# Patient Record
Sex: Female | Born: 1955 | Race: White | Hispanic: No | Marital: Married | Smoking: Never smoker
Health system: Southern US, Community
[De-identification: ages and names within clinical notes are randomized; demographics above are authoritative.]

## PROBLEM LIST (undated history)

## (undated) DIAGNOSIS — E079 Disorder of thyroid, unspecified: Secondary | ICD-10-CM

## (undated) DIAGNOSIS — K754 Autoimmune hepatitis: Secondary | ICD-10-CM

## (undated) HISTORY — DX: Autoimmune hepatitis: K75.4

## (undated) HISTORY — DX: Disorder of thyroid, unspecified: E07.9

## (undated) HISTORY — PX: PERCUTANEOUS LIVER BIOPSY: SUR136

---

## 2004-10-27 ENCOUNTER — Ambulatory Visit: Payer: Self-pay | Admitting: Unknown Physician Specialty

## 2005-09-07 ENCOUNTER — Ambulatory Visit: Payer: Self-pay | Admitting: Unknown Physician Specialty

## 2006-09-21 ENCOUNTER — Ambulatory Visit: Payer: Self-pay | Admitting: Unknown Physician Specialty

## 2006-12-06 ENCOUNTER — Ambulatory Visit: Payer: Self-pay | Admitting: Gastroenterology

## 2007-10-25 ENCOUNTER — Ambulatory Visit: Payer: Self-pay | Admitting: Unknown Physician Specialty

## 2008-10-25 ENCOUNTER — Ambulatory Visit: Payer: Self-pay | Admitting: Unknown Physician Specialty

## 2009-10-30 ENCOUNTER — Ambulatory Visit: Payer: Self-pay | Admitting: Unknown Physician Specialty

## 2010-11-03 ENCOUNTER — Ambulatory Visit: Payer: Self-pay | Admitting: Unknown Physician Specialty

## 2011-12-15 ENCOUNTER — Ambulatory Visit: Payer: Self-pay | Admitting: Unknown Physician Specialty

## 2011-12-16 ENCOUNTER — Encounter: Payer: Self-pay | Admitting: Internal Medicine

## 2011-12-16 ENCOUNTER — Ambulatory Visit (INDEPENDENT_AMBULATORY_CARE_PROVIDER_SITE_OTHER): Payer: PRIVATE HEALTH INSURANCE | Admitting: Internal Medicine

## 2011-12-16 DIAGNOSIS — E785 Hyperlipidemia, unspecified: Secondary | ICD-10-CM

## 2011-12-16 DIAGNOSIS — K754 Autoimmune hepatitis: Secondary | ICD-10-CM | POA: Insufficient documentation

## 2011-12-16 DIAGNOSIS — N951 Menopausal and female climacteric states: Secondary | ICD-10-CM

## 2011-12-16 NOTE — Assessment & Plan Note (Addendum)
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   Last LFts were normal 12/28.,  Needs q 6 months.  Dr. Woodfin Ganja at [facility]

## 2011-12-17 ENCOUNTER — Encounter: Payer: Self-pay | Admitting: Internal Medicine

## 2011-12-17 DIAGNOSIS — N951 Menopausal and female climacteric states: Secondary | ICD-10-CM | POA: Insufficient documentation

## 2011-12-17 DIAGNOSIS — K754 Autoimmune hepatitis: Secondary | ICD-10-CM | POA: Insufficient documentation

## 2011-12-17 NOTE — Assessment & Plan Note (Signed)
Manifested with hot flashes and depression both of which have improved with use of topical progesterone . I have discussed with her the fact that traditional medicine does not recognize utility of topical progesterone in the sense that it has not been shown to be anymore effective than placebo. However since she derives benefit from it I am willing to continue to refill her medications when needed.

## 2011-12-17 NOTE — Progress Notes (Signed)
Subjective:    Patient ID: Krista Olson, female    DOB: 12/01/1955, 56 y.o.   MRN: [id number]  HPI   Ms. Dargis is a 56 year old white female with a history of autoimmune hepatitis who is here to establish care. She is a patient of Dr. Francia Greaves and is transferring care today. She feels generally well except for occasional polyarthritis involving all the joints in her hands wrists and elbows. Her arthritis is aggravated by inactivity and improves after 10-15 minutes of motion in the mornings. She denies any history of joint redness and trauma. Her flares of arthritis or periodic. She has been treating them with nonsteroidal anti-inflammatories judiciously.  She has a history of menopausal disorder with hot flashes and some mood lability that has improved with use of hormone therapy in the form of topical progesterone.   Past Medical History  Diagnosis Date  . Hepatitis, autoimmune   . Thyroid disease    No current outpatient prescriptions on file prior to visit.    Review of Systems  Constitutional: Negative for fever, chills and unexpected weight change.  HENT: Negative for hearing loss, ear pain, nosebleeds, congestion, sore throat, facial swelling, rhinorrhea, sneezing, mouth sores, trouble swallowing, neck pain, neck stiffness, voice change, postnasal drip, sinus pressure, tinnitus and ear discharge.   Eyes: Negative for pain, discharge, redness and visual disturbance.  Respiratory: Negative for cough, chest tightness, shortness of breath, wheezing and stridor.   Cardiovascular: Negative for chest pain, palpitations and leg swelling.  Musculoskeletal: Negative for myalgias and arthralgias.  Skin: Negative for color change and rash.  Neurological: Negative for dizziness, weakness, light-headedness and headaches.  Hematological: Negative for adenopathy.   BP 118/70  Pulse 76  Temp(Src) 98.1 F (36.7 C) (Oral)  Ht 5' 2.25" (1.581 m)  Wt 122 lb (55.339 kg)  BMI 22.14 kg/m2       Objective:   Physical Exam  Constitutional: She is oriented to person, place, and time. She appears well-developed and well-nourished.  HENT:  Mouth/Throat: Oropharynx is clear and moist.  Eyes: EOM are normal. Pupils are equal, round, and reactive to light. No scleral icterus.  Neck: Normal range of motion. Neck supple. No JVD present. No thyromegaly present.  Cardiovascular: Normal rate, regular rhythm, normal heart sounds and intact distal pulses.   Pulmonary/Chest: Effort normal and breath sounds normal.  Abdominal: Soft. Bowel sounds are normal. She exhibits no mass. There is no tenderness.  Musculoskeletal: Normal range of motion. She exhibits no edema.  Lymphadenopathy:    She has no cervical adenopathy.  Neurological: She is alert and oriented to person, place, and time.  Skin: Skin is warm and dry.  Psychiatric: She has a normal mood and affect.      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   Last LFts were normal 12/28.,  Needs q 6 months.  Dr. Woodfin Ganja at [facility]   Other and unspecified hyperlipidemia Managed with statin therapy.  She would like to take a break and repeat her fasting lipids with her next 6 month LFT check in June   Menopause syndrome Manifested with hot flashes and depression both of which have improved with use of topical progesterone . I have discussed with her the fact that traditional medicine does not recognize utility of topical progesterone in the sense that it has not been shown to be anymore effective than placebo.  However since she derives benefit from it I am willing to continue to refill her medications when needed.    Updated Medication List Outpatient Encounter Prescriptions as of 12/16/2011  Medication Sig Dispense Refill  . atorvastatin (LIPITOR) 10 MG tablet Take 10 mg by mouth daily.      Marland Kitchen  azaTHIOprine (IMURAN) 50 MG tablet Take 50 mg by mouth daily.      Marland Kitchen levothyroxine (SYNTHROID, LEVOTHROID) 75 MCG tablet Take 75 mcg by mouth daily.      Marland Kitchen PROGESTERONE VA Use 3 grams vaginally for 20 days, stay off for 5 days

## 2011-12-17 NOTE — Assessment & Plan Note (Signed)
Managed with statin therapy.  She would like to take a break and repeat her fasting lipids with her next 6 month LFT check in June

## 2012-01-12 ENCOUNTER — Telehealth: Payer: Self-pay | Admitting: Internal Medicine

## 2012-01-12 NOTE — Telephone Encounter (Signed)
[facility] Triage Call Report Triage Record Num: [id number] Operator: Lesli Albee Patient Name: Krista Olson Call Date & Time: 01/12/2012 10:56:03AM Patient Phone: [phone] PCP: Patient Gender: Female PCP Fax : Patient DOB: [birth year] Practice Name: [facility] Reason for Call: Caller: [name]/Patient; PCP: Duncan Dull; CB#: [phone]; ; ; Call regarding Having Nasal Congestion and Drainage.; Pt has had nasal congestion x 2 days. No fever. Pt is calling to see if the office can call in anything for her. Rn advised abx or scripts would require an office visit. Pt is afeb. Rn triaged and advised home care with otc meds. Parameters reveiwed of when to call back. Pt flies on Thursday and is nervous about that. RN advised pt could come to the office for an office visit tomorrow prior to the flight. Protocol(s) Used: Upper Respiratory Infection (URI) Recommended Outcome per Protocol: Provide Home/Self Care Reason for Outcome: All other situations Care Advice: ~ Use a cool mist humidifier to moisten air. Be sure to clean according to manufacturer's instructions. ~ Consider use of a saline nasal spray per package directions to help relieve nasal congestion. ~ SYMPTOM / CONDITION MANAGEMENT Consider nonprescription decongestant (Sudafed, Drixoral) for relief of symptoms after checking with a provider, especially if there is a history of hypertension, hyperthyroidism, heart disease, diabetes, glaucoma, urinary retention caused by prostatic hypertrophy. ~ During pregnancy or when breastfeeding, do not take nonprescription, complementary/alternative medication(s) without the approval of provider ~ Analgesic/Antipyretic Advice - Acetaminophen: Consider acetaminophen as directed on label or by pharmacist/provider for pain or fever PRECAUTIONS: - Use if there is no history of liver disease, alcoholism, or intake of three or more alcohol drinks per day - Only if approved  by provider during pregnancy or when breastfeeding - During pregnancy, acetaminophen should not be taken more than 3 consecutive days without telling provider - Do not exceed recommended dose or frequency ~ 01/12/2012 11:02:17AM Page 1 of 1 [id number]

## 2012-01-13 NOTE — Telephone Encounter (Signed)
If patient does not come in for app, recommend use od sudafed PE or Afrin prior to flying to alleviate sinus pressure during changes in air pressure.  She can use up to 30 mg of sudafed PE ,  benadrylfor runny nose

## 2012-01-14 NOTE — Telephone Encounter (Signed)
Left message on cell phone voice mail advising patient. 

## 2012-05-06 ENCOUNTER — Other Ambulatory Visit: Payer: Self-pay | Admitting: Internal Medicine

## 2012-05-06 MED ORDER — ATORVASTATIN CALCIUM 10 MG PO TABS: 10.0000 mg | ORAL_TABLET | Freq: Every day | ORAL | Status: DC

## 2012-06-13 ENCOUNTER — Telehealth: Payer: Self-pay | Admitting: *Deleted

## 2012-06-13 ENCOUNTER — Other Ambulatory Visit: Payer: Self-pay | Admitting: Internal Medicine

## 2012-06-13 DIAGNOSIS — E079 Disorder of thyroid, unspecified: Secondary | ICD-10-CM

## 2012-06-13 DIAGNOSIS — E785 Hyperlipidemia, unspecified: Secondary | ICD-10-CM

## 2012-06-13 NOTE — Telephone Encounter (Signed)
Patient is coming in tomorrow for labs. What would you like for me to order?

## 2012-06-13 NOTE — Telephone Encounter (Signed)
Labs ordered.

## 2012-06-13 NOTE — Telephone Encounter (Signed)
Fasting lipids,  CMET.   TSH,  And CBC wi diff

## 2012-06-14 ENCOUNTER — Other Ambulatory Visit (INDEPENDENT_AMBULATORY_CARE_PROVIDER_SITE_OTHER): Payer: PRIVATE HEALTH INSURANCE | Admitting: *Deleted

## 2012-06-14 ENCOUNTER — Other Ambulatory Visit: Payer: Self-pay | Admitting: Internal Medicine

## 2012-06-14 DIAGNOSIS — E079 Disorder of thyroid, unspecified: Secondary | ICD-10-CM

## 2012-06-14 DIAGNOSIS — N951 Menopausal and female climacteric states: Secondary | ICD-10-CM

## 2012-06-14 DIAGNOSIS — E785 Hyperlipidemia, unspecified: Secondary | ICD-10-CM

## 2012-06-14 LAB — COMPREHENSIVE METABOLIC PANEL
Albumin: 4.4 g/dL (ref 3.5–5.2)
Alkaline Phosphatase: 68 U/L (ref 39–117)
BUN: 12 mg/dL (ref 6–23)
CO2: 28 mEq/L (ref 19–32)
GFR: 114.04 mL/min (ref >60.00)
Glucose, Bld: 83 mg/dL (ref 70–99)
Potassium: 4 mEq/L (ref 3.5–5.1)

## 2012-06-14 LAB — CBC WITH DIFFERENTIAL/PLATELET
Basophils Absolute: 0 10*3/uL (ref 0.0–0.1)
Eosinophils Relative: 1.9 % (ref 0.0–5.0)
HCT: 42.6 % (ref 36.0–46.0)
Lymphocytes Relative: 34.6 % (ref 12.0–46.0)
Lymphs Abs: 2.9 10*3/uL (ref 0.7–4.0)
Monocytes Relative: 6.9 % (ref 3.0–12.0)
Neutrophils Relative %: 56.1 % (ref 43.0–77.0)
Platelets: 285 10*3/uL (ref 150.0–400.0)
RDW: 13.6 % (ref 11.5–14.6)
WBC: 8.5 10*3/uL (ref 4.5–10.5)

## 2012-06-14 LAB — TSH: TSH: 1.69 u[IU]/mL (ref 0.35–5.50)

## 2012-06-14 LAB — LIPID PANEL
HDL: 70.7 mg/dL (ref >39.00)
Triglycerides: 172 mg/dL — ABNORMAL HIGH (ref 0.0–149.0)

## 2012-06-14 NOTE — Progress Notes (Signed)
Opened in error

## 2012-06-15 LAB — LDL CHOLESTEROL, DIRECT: Direct LDL: 122.8 mg/dL

## 2012-06-27 ENCOUNTER — Other Ambulatory Visit: Payer: Self-pay | Admitting: Internal Medicine

## 2012-06-27 MED ORDER — LEVOTHYROXINE SODIUM 75 MCG PO TABS: 75.0000 ug | ORAL_TABLET | Freq: Every day | ORAL | Status: DC

## 2012-06-27 MED ORDER — ATORVASTATIN CALCIUM 10 MG PO TABS: 10.0000 mg | ORAL_TABLET | Freq: Every day | ORAL | Status: DC

## 2012-09-29 ENCOUNTER — Telehealth: Payer: Self-pay | Admitting: Internal Medicine

## 2012-09-29 ENCOUNTER — Other Ambulatory Visit: Payer: Self-pay

## 2012-09-29 MED ORDER — ATORVASTATIN CALCIUM 10 MG PO TABS: 10.0000 mg | ORAL_TABLET | Freq: Every day | ORAL | Status: DC

## 2012-09-29 MED ORDER — LEVOTHYROXINE SODIUM 75 MCG PO TABS: 75.0000 ug | ORAL_TABLET | Freq: Every day | ORAL | Status: DC

## 2012-09-29 NOTE — Telephone Encounter (Signed)
Refill request for Levothyroxine 75 mcg #30 3 R sent electronic to [facility].

## 2012-09-29 NOTE — Telephone Encounter (Signed)
Refill request for Lipitor 10 mg # 30 3 R sent electronic to [facility] pharmacy.

## 2012-09-29 NOTE — Telephone Encounter (Signed)
Pt schedule cpx in mar  She wanted to know if you wanted her to wait till mar to do her mammogram on can she get this done in dec [place]   Any time

## 2012-12-15 ENCOUNTER — Ambulatory Visit: Payer: Self-pay | Admitting: Internal Medicine

## 2012-12-28 ENCOUNTER — Encounter: Payer: Self-pay | Admitting: Internal Medicine

## 2013-01-30 ENCOUNTER — Ambulatory Visit (INDEPENDENT_AMBULATORY_CARE_PROVIDER_SITE_OTHER): Payer: PRIVATE HEALTH INSURANCE | Admitting: Internal Medicine

## 2013-01-30 ENCOUNTER — Other Ambulatory Visit (HOSPITAL_COMMUNITY)
Admission: RE | Admit: 2013-01-30 | Discharge: 2013-01-30 | Disposition: A | Discharge status: Home / Self Care | Payer: PRIVATE HEALTH INSURANCE | Source: Ambulatory Visit | Attending: Internal Medicine | Admitting: Internal Medicine

## 2013-01-30 ENCOUNTER — Encounter: Payer: Self-pay | Admitting: Internal Medicine

## 2013-01-30 ENCOUNTER — Other Ambulatory Visit: Payer: Self-pay | Admitting: Internal Medicine

## 2013-01-30 VITALS — BP 120/76 | HR 73 | Temp 98.2°F | Resp 16 | Ht 62.5 in | Wt 128.0 lb

## 2013-01-30 DIAGNOSIS — Z79899 Other long term (current) drug therapy: Secondary | ICD-10-CM

## 2013-01-30 DIAGNOSIS — Z23 Encounter for immunization: Secondary | ICD-10-CM

## 2013-01-30 DIAGNOSIS — E039 Hypothyroidism, unspecified: Secondary | ICD-10-CM

## 2013-01-30 DIAGNOSIS — E785 Hyperlipidemia, unspecified: Secondary | ICD-10-CM

## 2013-01-30 DIAGNOSIS — Z0000 Encounter for general adult medical examination without abnormal findings: Secondary | ICD-10-CM

## 2013-01-30 DIAGNOSIS — Z124 Encounter for screening for malignant neoplasm of cervix: Secondary | ICD-10-CM

## 2013-01-30 DIAGNOSIS — K754 Autoimmune hepatitis: Secondary | ICD-10-CM

## 2013-01-30 DIAGNOSIS — Z01419 Encounter for gynecological examination (general) (routine) without abnormal findings: Secondary | ICD-10-CM | POA: Insufficient documentation

## 2013-01-30 DIAGNOSIS — Z1151 Encounter for screening for human papillomavirus (HPV): Secondary | ICD-10-CM | POA: Insufficient documentation

## 2013-01-30 NOTE — Progress Notes (Signed)
Patient ID: Krista Olson, female   DOB: 18-Oct-1956, 57 y.o.   MRN: [id number]  Subjective:     Krista Olson is a 57 y.o. female and is here for a comprehensive physical exam. The patient reports no problems.  History   Social History  . Marital Status: Married    Spouse Name: N/A    Number of Children: N/A  . Years of Education: N/A   Occupational History  . Not on file.   Social History Main Topics  . Smoking status: Never Smoker   . Smokeless tobacco: Not on file  . Alcohol Use: Not on file  . Drug Use: Not on file  . Sexually Active: Not on file   Other Topics Concern  . Not on file   Social History Narrative  . No narrative on file   Health Maintenance  Topic Date Due  . Tetanus/tdap  10/25/1974  . Influenza Vaccine  07/24/2013  . Pap Smear  10/23/2014  . Mammogram  12/15/2014  . Colonoscopy  12/06/2016    The following portions of the patient's history were reviewed and updated as appropriate: allergies, current medications, past family history, past medical history, past social history, past surgical history and problem list.  Review of Systems A comprehensive review of systems was negative.   Objective:   General Appearance:    Alert, cooperative, no distress, appears stated age  Head:    Normocephalic, without obvious abnormality, atraumatic  Eyes:    PERRL, conjunctiva/corneas clear, EOM's intact, fundi    benign, both eyes  Ears:    Normal TM's and external ear canals, both ears  Nose:   Nares normal, septum midline, mucosa normal, no drainage    or sinus tenderness  Throat:   Lips, mucosa, and tongue normal; teeth and gums normal  Neck:   Supple, symmetrical, trachea midline, no adenopathy;    thyroid:  no enlargement/tenderness/nodules; no carotid   bruit or JVD  Back:     Symmetric, no curvature, ROM normal, no CVA tenderness  Lungs:     Clear to auscultation bilaterally, respirations unlabored  Chest Wall:    No tenderness or deformity   Heart:     Regular rate and rhythm, S1 and S2 normal, no murmur, rub   or gallop  Breast Exam:    No tenderness, masses, or nipple abnormality  Abdomen:     Soft, non-tender, bowel sounds active all four quadrants,    no masses, no organomegaly  Genitalia:    Pelvic: cervix normal in appearance, external genitalia normal, no adnexal masses or tenderness, no cervical motion tenderness, rectovaginal septum normal, uterus normal size, shape, and consistency and vagina normal without discharge  Extremities:   Extremities normal, atraumatic, no cyanosis or edema  Pulses:   2+ and symmetric all extremities  Skin:   Skin color, texture, turgor normal, no rashes or lesions  Lymph nodes:   Cervical, supraclavicular, and axillary nodes normal  Neurologic:   CNII-XII intact, normal strength, sensation and reflexes    throughout      Assessment:   Routine general medical examination at a health care facility Annual comprehensive exam was done including breast, pelvic and PAP smear. All screenings have been addressed .   Autoimmune hepatitis Managed with Imuran ,.  She is overdue for surveillance labs   Other and unspecified hyperlipidemia Well controlled on current regimen. Liver function stable, no changes today.    Updated Medication List Outpatient Encounter Prescriptions as of 01/30/2013  Medication  Sig Dispense Refill  . atorvastatin (LIPITOR) 10 MG tablet Take 1 tablet (10 mg total) by mouth daily.  30 tablet  3  . azaTHIOprine (IMURAN) 50 MG tablet Take 50 mg by mouth daily.      . Calcium Carbonate-Vitamin D (CALCIUM + D PO) Take 1 tablet by mouth daily.      . fish oil-omega-3 fatty acids 1000 MG capsule Take 1 capsule by mouth daily.      Marland Kitchen levothyroxine (SYNTHROID, LEVOTHROID) 75 MCG tablet Take 1 tablet (75 mcg total) by mouth daily.  30 tablet  3  . Multiple Vitamin (MULTIVITAMIN) tablet Take 1 tablet by mouth daily.      Marland Kitchen PROGESTERONE VA Use 3 grams vaginally for 20 days, stay off for 5  days       No facility-administered encounter medications on file as of 01/30/2013.

## 2013-01-30 NOTE — Patient Instructions (Addendum)
I will check on your vaccine status for TDap from Dr Lin Givens office   I will send your labs to Dr. Piedad Climes at [facility]

## 2013-01-31 ENCOUNTER — Encounter: Payer: Self-pay | Admitting: Internal Medicine

## 2013-01-31 LAB — CBC WITH DIFFERENTIAL/PLATELET
Basophils Absolute: 0 10*3/uL (ref 0.0–0.1)
Eosinophils Relative: 0.9 % (ref 0.0–5.0)
HCT: 38.6 % (ref 36.0–46.0)
Lymphocytes Relative: 31.1 % (ref 12.0–46.0)
Monocytes Relative: 8.9 % (ref 3.0–12.0)
Neutrophils Relative %: 58.9 % (ref 43.0–77.0)
Platelets: 231 10*3/uL (ref 150.0–400.0)
WBC: 7 10*3/uL (ref 4.5–10.5)

## 2013-01-31 LAB — COMPREHENSIVE METABOLIC PANEL
Albumin: 4.2 g/dL (ref 3.5–5.2)
CO2: 26 mEq/L (ref 19–32)
Calcium: 9.2 mg/dL (ref 8.4–10.5)
Chloride: 103 mEq/L (ref 96–112)
GFR: 107.35 mL/min (ref >60.00)
Glucose, Bld: 92 mg/dL (ref 70–99)
Sodium: 138 mEq/L (ref 135–145)
Total Bilirubin: 0.7 mg/dL (ref 0.3–1.2)
Total Protein: 7.6 g/dL (ref 6.0–8.3)

## 2013-01-31 LAB — TSH: TSH: 0.62 u[IU]/mL (ref 0.35–5.50)

## 2013-01-31 NOTE — Progress Notes (Deleted)
Patient ID: Krista Olson, female   DOB: February 02, 1956, 57 y.o.   MRN: [id number]   Patient Active Problem List  Diagnosis  . Autoimmune hepatitis  . Other and unspecified hyperlipidemia  . Menopause syndrome  . Routine general medical examination at a health care facility    Subjective:  CC:   Chief Complaint  Patient presents with  . Annual Exam    HPI:   Krista Olson a 57 y.o. female who presents  Past Medical History  Diagnosis Date  . Hepatitis, autoimmune   . Thyroid disease     Past Surgical History  Procedure Laterality Date  . Percutaneous liver biopsy         The following portions of the patient's history were reviewed and updated as appropriate: Allergies, current medications, and problem list.    Review of Systems:   12 Pt  review of systems was negative except those addressed in the HPI,     History   Social History  . Marital Status: Married    Spouse Name: N/A    Number of Children: N/A  . Years of Education: N/A   Occupational History  . Not on file.   Social History Main Topics  . Smoking status: Never Smoker   . Smokeless tobacco: Not on file  . Alcohol Use: Not on file  . Drug Use: Not on file  . Sexually Active: Not on file   Other Topics Concern  . Not on file   Social History Narrative  . No narrative on file    Objective:  BP 120/76  Pulse 73  Temp(Src) 98.2 F (36.8 C) (Oral)  Resp 16  Ht 5' 2.5" (1.588 m)  Wt 128 lb (58.06 kg)  BMI 23.02 kg/m2  SpO2 98%  General appearance: alert, cooperative and appears stated age Ears: normal TM's and external ear canals both ears Throat: lips, mucosa, and tongue normal; teeth and gums normal Neck: no adenopathy, no carotid bruit, supple, symmetrical, trachea midline and thyroid not enlarged, symmetric, no tenderness/mass/nodules Back: symmetric, no curvature. ROM normal. No CVA tenderness. Lungs: clear to auscultation bilaterally Heart: regular rate and rhythm, S1, S2  normal, no murmur, click, rub or gallop Abdomen: soft, non-tender; bowel sounds normal; no masses,  no organomegaly Pulses: 2+ and symmetric Skin: Skin color, texture, turgor normal. No rashes or lesions Lymph nodes: Cervical, supraclavicular, and axillary nodes normal.  Assessment and Plan:  Routine general medical examination at a health care facility Annual comprehensive exam was done including breast, pelvic and PAP smear. All screenings have been addressed .   Autoimmune hepatitis Managed with Imuran ,.  She is overdue for surveillance labs   Other and unspecified hyperlipidemia Well controlled on current regimen. Liver function stable, no changes today.    Updated Medication List Outpatient Encounter Prescriptions as of 01/30/2013  Medication Sig Dispense Refill  . atorvastatin (LIPITOR) 10 MG tablet Take 1 tablet (10 mg total) by mouth daily.  30 tablet  3  . azaTHIOprine (IMURAN) 50 MG tablet Take 50 mg by mouth daily.      . Calcium Carbonate-Vitamin D (CALCIUM + D PO) Take 1 tablet by mouth daily.      . fish oil-omega-3 fatty acids 1000 MG capsule Take 1 capsule by mouth daily.      Marland Kitchen levothyroxine (SYNTHROID, LEVOTHROID) 75 MCG tablet Take 1 tablet (75 mcg total) by mouth daily.  30 tablet  3  . Multiple Vitamin (MULTIVITAMIN) tablet Take 1 tablet by mouth  daily.      Marland Kitchen PROGESTERONE VA Use 3 grams vaginally for 20 days, stay off for 5 days       No facility-administered encounter medications on file as of 01/30/2013.     Orders Placed This Encounter  Procedures  . Comprehensive metabolic panel  . CBC with Differential  . TSH  . Varicella Zoster Abs, IgG/IgM    No Follow-up on file.

## 2013-01-31 NOTE — Assessment & Plan Note (Signed)
Well controlled on current regimen. Liver function stable, no changes today. 

## 2013-01-31 NOTE — Assessment & Plan Note (Signed)
Annual comprehensive exam was done including breast, pelvic and PAP smear. All screenings have been addressed .  

## 2013-01-31 NOTE — Assessment & Plan Note (Signed)
Managed with Imuran ,.  She is overdue for surveillance labs

## 2013-02-02 ENCOUNTER — Telehealth: Payer: Self-pay | Admitting: *Deleted

## 2013-02-02 NOTE — Telephone Encounter (Signed)
Refill Request  Progesterone 3 % HRT CRM   #30  Apply 1/2 ml one to two times daily for 25 days a month

## 2013-02-03 LAB — HM PAP SMEAR: HM Pap smear: NORMAL

## 2013-02-03 NOTE — Telephone Encounter (Signed)
Med phoned in °

## 2013-02-03 NOTE — Telephone Encounter (Signed)
Ok to refill,  By phone.  Epic does not have a 3% available.

## 2013-02-15 ENCOUNTER — Telehealth: Payer: Self-pay | Admitting: *Deleted

## 2013-02-15 NOTE — Telephone Encounter (Signed)
I do not see progesterone 3% in epic Please advise.

## 2013-02-15 NOTE — Telephone Encounter (Signed)
Refill request  Progesterone 3% hrt crm  #30  Apply 1/2 ML one to two times daily for 25 days a month

## 2013-02-15 NOTE — Telephone Encounter (Signed)
Ok to refill,  You will have to call in since this is coumpounded by the pharmacy .  11 refills.

## 2013-02-15 NOTE — Telephone Encounter (Signed)
Med Filled on 02/15/13 with 11 refills.

## 2013-03-06 ENCOUNTER — Other Ambulatory Visit: Payer: Self-pay | Admitting: *Deleted

## 2013-03-06 MED ORDER — ATORVASTATIN CALCIUM 10 MG PO TABS: 10.0000 mg | ORAL_TABLET | Freq: Every day | ORAL | Status: DC

## 2013-03-06 MED ORDER — LEVOTHYROXINE SODIUM 75 MCG PO TABS: 75.0000 ug | ORAL_TABLET | Freq: Every day | ORAL | Status: DC

## 2013-04-13 ENCOUNTER — Telehealth: Payer: Self-pay | Admitting: *Deleted

## 2013-04-13 NOTE — Telephone Encounter (Signed)
Would like for lab work especially labs concerning liver enzymes be faxed  faxed to [facility] Dr. Marylen Ponto. Piedad Climes MD phone [phone]. Please advise if I need any authorization.

## 2013-04-13 NOTE — Telephone Encounter (Signed)
No,  If patient requests it , that is all you need

## 2013-04-14 NOTE — Telephone Encounter (Signed)
Faxed to Dr. Harrel Lemon office, fax [phone]

## 2013-04-20 ENCOUNTER — Encounter: Payer: Self-pay | Admitting: Internal Medicine

## 2013-09-29 ENCOUNTER — Other Ambulatory Visit: Payer: Self-pay | Admitting: *Deleted

## 2013-09-29 MED ORDER — ATORVASTATIN CALCIUM 10 MG PO TABS: 10.0000 mg | ORAL_TABLET | Freq: Every day | ORAL | Status: DC

## 2013-09-29 MED ORDER — LEVOTHYROXINE SODIUM 75 MCG PO TABS: 75.0000 ug | ORAL_TABLET | Freq: Every day | ORAL | Status: DC

## 2014-02-01 ENCOUNTER — Encounter: Payer: Self-pay | Admitting: Internal Medicine

## 2014-02-01 ENCOUNTER — Encounter (INDEPENDENT_AMBULATORY_CARE_PROVIDER_SITE_OTHER): Payer: Self-pay

## 2014-02-01 ENCOUNTER — Ambulatory Visit (INDEPENDENT_AMBULATORY_CARE_PROVIDER_SITE_OTHER): Payer: PRIVATE HEALTH INSURANCE | Admitting: Internal Medicine

## 2014-02-01 VITALS — BP 120/70 | HR 66 | Temp 98.4°F | Resp 16 | Ht 62.0 in | Wt 128.0 lb

## 2014-02-01 DIAGNOSIS — E559 Vitamin D deficiency, unspecified: Secondary | ICD-10-CM

## 2014-02-01 DIAGNOSIS — Z1211 Encounter for screening for malignant neoplasm of colon: Secondary | ICD-10-CM

## 2014-02-01 DIAGNOSIS — R5383 Other fatigue: Secondary | ICD-10-CM

## 2014-02-01 DIAGNOSIS — Z1159 Encounter for screening for other viral diseases: Secondary | ICD-10-CM

## 2014-02-01 DIAGNOSIS — Z23 Encounter for immunization: Secondary | ICD-10-CM

## 2014-02-01 DIAGNOSIS — K754 Autoimmune hepatitis: Secondary | ICD-10-CM

## 2014-02-01 DIAGNOSIS — Z0000 Encounter for general adult medical examination without abnormal findings: Secondary | ICD-10-CM

## 2014-02-01 DIAGNOSIS — E785 Hyperlipidemia, unspecified: Secondary | ICD-10-CM

## 2014-02-01 DIAGNOSIS — N63 Unspecified lump in unspecified breast: Secondary | ICD-10-CM

## 2014-02-01 DIAGNOSIS — R5381 Other malaise: Secondary | ICD-10-CM

## 2014-02-01 NOTE — Patient Instructions (Addendum)
Return in late April for fast labs and repeat hepatic panel   You need 800 IUs of Vit D3 daily for good bone health  You had your wellness exam today.  Next PAP smear is due 2017  We will schedule your mammogram soon.  Please use the stool kit to send [place] back a sample to test for blood.  This is your colon CA screening test.    You received the TDap  vaccine today.  We will contact you with the bloodwork results

## 2014-02-01 NOTE — Progress Notes (Signed)
Patient ID: Krista Olson, female   DOB: 18-Apr-1956, 58 y.o.   MRN: [id number]   Subjective:     Krista Olson is a 58 y.o. female and is here for a comprehensive physical exam. The patient reports a  vague feeling in right breast,m  Intermittent,  Not pain or throbbing,  Difficult to describe,  Does not feel a mass.  History of autoimmune hepatitis .  Her ALT and AST and GGT  Were slightly elevated in late Jan at [facility].     Needs repeat quarterly per [facility]    History   Social History  . Marital Status: Married    Spouse Name: N/A    Number of Children: N/A  . Years of Education: N/A   Occupational History  . Not on file.   Social History Main Topics  . Smoking status: Never Smoker   . Smokeless tobacco: Not on file  . Alcohol Use: Not on file  . Drug Use: Not on file  . Sexual Activity: Not on file   Other Topics Concern  . Not on file   Social History Narrative  . No narrative on file   Health Maintenance  Topic Date Due  . Influenza Vaccine  06/23/2013  . Mammogram  12/15/2014  . Pap Smear  01/31/2016  . Colonoscopy  12/06/2016  . Tetanus/tdap  02/02/2024    The following portions of the patient's history were reviewed and updated as appropriate: allergies, current medications, past family history, past medical history, past social history, past surgical history and problem list.  Review of Systems A comprehensive review of systems was negative.   Objective:   BP 120/70  Pulse 66  Temp(Src) 98.4 F (36.9 C) (Oral)  Resp 16  Ht 5\' 2"  (1.575 m)  Wt 128 lb (58.06 kg)  BMI 23.41 kg/m2  SpO2 98%  General Appearance:    Alert, cooperative, no distress, appears stated age  Head:    Normocephalic, without obvious abnormality, atraumatic  Eyes:    PERRL, conjunctiva/corneas clear, EOM's intact, fundi    benign, both eyes  Ears:    Normal TM's and external ear canals, both ears  Nose:   Nares normal, septum midline, mucosa normal, no drainage    or sinus  tenderness  Throat:   Lips, mucosa, and tongue normal; teeth and gums normal  Neck:   Supple, symmetrical, trachea midline, no adenopathy;    thyroid:  no enlargement/tenderness/nodules; no carotid   bruit or JVD  Back:     Symmetric, no curvature, ROM normal, no CVA tenderness  Lungs:     Clear to auscultation bilaterally, respirations unlabored  Chest Wall:    No tenderness or deformity   Heart:    Regular rate and rhythm, S1 and S2 normal, no murmur, rub   or gallop  Breast Exam:    Small nontender mass at 9:00 position of right breast .  ;eft breast without tenderness, masses, or nipple abnormality  Abdomen:     Soft, non-tender, bowel sounds active all four quadrants,    no masses, no organomegaly     Extremities:   Extremities normal, atraumatic, no cyanosis or edema  Pulses:   2+ and symmetric all extremities  Skin:   Skin color, texture, turgor normal, no rashes or lesions  Lymph nodes:   Cervical, supraclavicular, and axillary nodes normal  Neurologic:   CNII-XII intact, normal strength, sensation and reflexes    throughout      Assessment and Plan:  Other and unspecified hyperlipidemia Historically Well controlled on current regimen. Liver function stable, no changes today.     Autoimmune hepatitis Managed with Imuran since 2013, quarterly labs are due.   Routine general medical examination at a health care facility Annual comprehensive exam was done including breast, excluding pelvic and PAP smear. All screenings have been addressed .   Breast nodule Palpable nodule in right breast was noted .  Given her concurrent concern about abnormal feeling in the breast diagnostic mammogram ordered.    Updated Medication List Outpatient Encounter Prescriptions as of 02/01/2014  Medication Sig  . atorvastatin (LIPITOR) 10 MG tablet Take 1 tablet (10 mg total) by mouth daily.  Marland Kitchen azaTHIOprine (IMURAN) 50 MG tablet Take 50 mg by mouth daily.  . Calcium Carbonate-Vitamin D  (CALCIUM + D PO) Take 1 tablet by mouth daily.  . fish oil-omega-3 fatty acids 1000 MG capsule Take 1 capsule by mouth daily.  Marland Kitchen levothyroxine (SYNTHROID, LEVOTHROID) 75 MCG tablet Take 1 tablet (75 mcg total) by mouth daily.  . Multiple Vitamin (MULTIVITAMIN) tablet Take 1 tablet by mouth daily.  Marland Kitchen PROGESTERONE VA Progesterone 3% hrt crm. Apply 1/5mL vaginally one to two times daily for 25 days a month. This must be Called in for refills since compunded. This was called in on 02/15/13 with 11 refills.

## 2014-02-03 ENCOUNTER — Encounter: Payer: Self-pay | Admitting: Internal Medicine

## 2014-02-03 DIAGNOSIS — N63 Unspecified lump in unspecified breast: Secondary | ICD-10-CM | POA: Insufficient documentation

## 2014-02-03 NOTE — Assessment & Plan Note (Signed)
Managed with Imuran since 2013, quarterly labs are due.

## 2014-02-03 NOTE — Assessment & Plan Note (Signed)
Historically Well controlled on current regimen. Liver function stable, no changes today.

## 2014-02-03 NOTE — Assessment & Plan Note (Signed)
Annual comprehensive exam was done including breast, excluding pelvic and PAP smear. All screenings have been addressed .  

## 2014-02-03 NOTE — Assessment & Plan Note (Signed)
Palpable nodule in right breast was noted .  Given her concurrent concern about abnormal feeling in the breast diagnostic mammogram ordered.

## 2014-02-22 ENCOUNTER — Ambulatory Visit: Payer: Self-pay | Admitting: Internal Medicine

## 2014-03-05 ENCOUNTER — Other Ambulatory Visit: Payer: Self-pay | Admitting: Internal Medicine

## 2014-03-06 ENCOUNTER — Telehealth: Payer: Self-pay | Admitting: *Deleted

## 2014-03-06 NOTE — Telephone Encounter (Signed)
Refill on progesterone on hold until breast issues have been fully explored, (diagnostic mammo was ordere at her physical last month and I have not record it was done)

## 2014-03-06 NOTE — Telephone Encounter (Signed)
Ok refill? 

## 2014-03-06 NOTE — Telephone Encounter (Signed)
Refill request  Progesterone 3% cream   Apply 1/2 ML topically to site directed by physician 1-2 times daily or 25 days/month

## 2014-03-11 LAB — HM MAMMOGRAPHY: HM Mammogram: NORMAL

## 2014-03-12 ENCOUNTER — Telehealth: Payer: Self-pay | Admitting: Internal Medicine

## 2014-03-12 ENCOUNTER — Telehealth: Payer: Self-pay | Admitting: *Deleted

## 2014-03-12 NOTE — Telephone Encounter (Signed)
Refill Request  Progesterone 3%  Apply 1/2 topically to site directed by physician 1-2 times daily or 25 days/month

## 2014-03-12 NOTE — Telephone Encounter (Signed)
FYI

## 2014-03-12 NOTE — Telephone Encounter (Signed)
Patient scheduled for tomorrow at 2.45. FYI

## 2014-03-12 NOTE — Telephone Encounter (Signed)
Patient Information:  Caller Name: Jeneva  Phone: [phone]  Patient: Krista Olson, Krista Olson  Gender: Female  DOB: 13-Aug-1956  Age: 58 Years  PCP: Deborra Medina (Adults only)  Office Follow Up:  Does the office need to follow up with this patient?: No  Instructions For The Office: N/A   Symptoms  Reason For Call & Symptoms: Sinus congestion onset 03/02/14 with associated cough.  Reviewed Health History In EMR: Yes  Reviewed Medications In EMR: Yes  Reviewed Allergies In EMR: Yes  Reviewed Surgeries / Procedures: Yes  Date of Onset of Symptoms: 03/02/2014  Treatments Tried: Claritin, Sudafed, Neti Pot with some relief  Treatments Tried Worked: Yes  Guideline(s) Used:  Sinus Pain and Congestion  Disposition Per Guideline:   See Today or Tomorrow in Office  Reason For Disposition Reached:   Sinus congestion (pressure, fullness) present > 10 days  Advice Given:  Reassurance:   Sinus congestion is a normal part of a cold.  Antibiotics are not helpful for the sinus congestion that occurs with colds.  For a Runny Nose With Profuse Discharge:  Nasal mucus and discharge helps to wash viruses and bacteria out of the nose and sinuses.  Blowing the nose is all that is needed.  If the skin around your nostrils gets irritated, apply a tiny amount of petroleum ointment to the nasal openings once or twice a day.  For a Stuffy Nose - Use Nasal Washes:  Introduction: Saline (salt water) nasal irrigation (nasal wash) is an effective and simple home remedy for treating stuffy nose and sinus congestion. The nose can be irrigated by pouring, spraying, or squirting salt water into the nose and then letting it run back out.  Methods: There are several ways to perform nasal irrigation. You can use a saline nasal spray bottle (available over-the-counter), a rubber ear syringe, a medical syringe without the needle, or a Neti Pot.  Pain and Fever Medicines:  Acetaminophen (e.g., Tylenol):  Regular Strength  Tylenol: Take 650 mg (two 325 mg pills) by mouth every 4-6 hours as needed. Each Regular Strength Tylenol pill has 325 mg of acetaminophen.  Extra Strength Tylenol: Take 1,000 mg (two 500 mg pills) every 8 hours as needed. Each Extra Strength Tylenol pill has 500 mg of acetaminophen.  The most you should take each day is 3,000 mg (10 Regular Strength or 6 Extra Strength pills a day).  Hydration:  Drink plenty of liquids (6-8 glasses of water daily). If the air in your home is dry, use a cool mist humidifier  Expected Course:  Sinus congestion from viral upper respiratory infections (colds) usually lasts 5-10 days.  Occasionally a cold can worsen and turn into bacterial sinusitis. Clues to this are sinus symptoms lasting longer than 10 days, fever lasting longer than 3 days, and worsening pain. Bacterial sinusitis may need antibiotic treatment.  Call Back If:   Sinus congestion (fullness) lasts longer than 10 days  You become worse.  RN Overrode Recommendation:  Document Patient  Caller also requests refill on Progesterone.

## 2014-03-12 NOTE — Telephone Encounter (Signed)
Phoned in as requested.

## 2014-03-12 NOTE — Telephone Encounter (Signed)
Pt left vm asking to discuss sinus issues.  Returned pt call.  Pt asking for advice on OTC meds.  Transferred to triage.

## 2014-03-13 ENCOUNTER — Encounter: Payer: Self-pay | Admitting: Adult Health

## 2014-03-13 ENCOUNTER — Ambulatory Visit (INDEPENDENT_AMBULATORY_CARE_PROVIDER_SITE_OTHER): Payer: PRIVATE HEALTH INSURANCE | Admitting: Adult Health

## 2014-03-13 VITALS — BP 100/60 | HR 84 | Temp 98.4°F | Resp 14 | Wt 125.0 lb

## 2014-03-13 DIAGNOSIS — J329 Chronic sinusitis, unspecified: Secondary | ICD-10-CM

## 2014-03-13 MED ORDER — AMOXICILLIN-POT CLAVULANATE 875-125 MG PO TABS: 1.0000 | ORAL_TABLET | Freq: Two times a day (BID) | ORAL | Status: DC

## 2014-03-13 NOTE — Progress Notes (Signed)
   Subjective:    Patient ID: Krista Olson, female    DOB: [birth year], 58 y.o.   MRN: [id number]  HPI Pt is a pleasant 58 y/o female who presents with sinus congestion, cough, post nasal drip, sore throat that began ~ 12 days ago. Her throat is no longer hurting. She has been taking claritin and pseudafed but not improvement. No fever or chills. Is feeling fatigue. Has been irrigating sinuses with nettie pot.  Current Outpatient Prescriptions on File Prior to Visit  Medication Sig Dispense Refill  . atorvastatin (LIPITOR) 10 MG tablet TAKE 1 TABLET BY MOUTH ONCE DAILY.  30 tablet  0  . azaTHIOprine (IMURAN) 50 MG tablet Take 50 mg by mouth daily.      . Calcium Carbonate-Vitamin D (CALCIUM + D PO) Take 1 tablet by mouth daily.      . fish oil-omega-3 fatty acids 1000 MG capsule Take 1 capsule by mouth daily.      Marland Kitchen levothyroxine (SYNTHROID, LEVOTHROID) 75 MCG tablet TAKE 1 TABLET BY MOUTH EACH MORNING FOR THYROID. BEST ON EMPTY STOMACH.  30 tablet  0  . Multiple Vitamin (MULTIVITAMIN) tablet Take 1 tablet by mouth daily.      Marland Kitchen PROGESTERONE VA Progesterone 3% hrt crm. Apply 1/25mL vaginally one to two times daily for 25 days a month. This must be Called in for refills since compunded. This was called in on 02/15/13 with 11 refills.       No current facility-administered medications on file prior to visit.      Review of Systems  Constitutional: Negative for fever and chills.  HENT: Positive for congestion, postnasal drip, rhinorrhea and sinus pressure. Negative for sore throat.   Respiratory: Positive for cough.   All other systems reviewed and are negative.      Objective:   Physical Exam  Constitutional: She is oriented to person, place, and time. She appears well-developed and well-nourished. No distress.  HENT:  Head: Normocephalic and atraumatic.  Right Ear: External ear normal.  Left Ear: External ear normal.  Mouth/Throat: No oropharyngeal exudate.  Cardiovascular: Normal  rate, regular rhythm and normal heart sounds.  Exam reveals no gallop.   No murmur heard. Pulmonary/Chest: Effort normal and breath sounds normal. No respiratory distress. She has no wheezes. She has no rales.  Lymphadenopathy:    She has cervical adenopathy.  Neurological: She is alert and oriented to person, place, and time.  Psychiatric: She has a normal mood and affect. Her behavior is normal. Judgment and thought content normal.      Assessment & Plan:   1. Sinusitis Symptoms ongoing for > 12 days. Start Augmentin bid x 7 days. Continue OTC meds for specific symptoms that may be bothersome

## 2014-03-13 NOTE — Patient Instructions (Signed)
.  Augmentin twice a day for 7 days. Take with food if it upsets your stomach,  Consider taking a probiotic while on the antibiotic. You can purchase one over the counter.  Continue taking Claritin daily.  You may consider taking Delsym for cough. This contains guaifenesin and dextromethorphan. This is only if your cough becomes more consistent.

## 2014-03-13 NOTE — Progress Notes (Signed)
Pre visit review using our clinic review tool, if applicable. No additional management support is needed unless otherwise documented below in the visit note. 

## 2014-04-05 ENCOUNTER — Encounter: Payer: Self-pay | Admitting: Internal Medicine

## 2014-05-01 ENCOUNTER — Other Ambulatory Visit: Payer: Self-pay | Admitting: Internal Medicine

## 2014-05-09 ENCOUNTER — Telehealth: Payer: Self-pay | Admitting: *Deleted

## 2014-05-09 ENCOUNTER — Other Ambulatory Visit (INDEPENDENT_AMBULATORY_CARE_PROVIDER_SITE_OTHER): Payer: PRIVATE HEALTH INSURANCE

## 2014-05-09 DIAGNOSIS — Z79899 Other long term (current) drug therapy: Secondary | ICD-10-CM

## 2014-05-09 DIAGNOSIS — R5383 Other fatigue: Secondary | ICD-10-CM

## 2014-05-09 DIAGNOSIS — E785 Hyperlipidemia, unspecified: Secondary | ICD-10-CM

## 2014-05-09 DIAGNOSIS — E559 Vitamin D deficiency, unspecified: Secondary | ICD-10-CM

## 2014-05-09 DIAGNOSIS — Z1159 Encounter for screening for other viral diseases: Secondary | ICD-10-CM

## 2014-05-09 DIAGNOSIS — K754 Autoimmune hepatitis: Secondary | ICD-10-CM

## 2014-05-09 DIAGNOSIS — Z23 Encounter for immunization: Secondary | ICD-10-CM

## 2014-05-09 DIAGNOSIS — R5381 Other malaise: Secondary | ICD-10-CM

## 2014-05-09 DIAGNOSIS — Z1211 Encounter for screening for malignant neoplasm of colon: Secondary | ICD-10-CM

## 2014-05-09 LAB — COMPREHENSIVE METABOLIC PANEL
ALK PHOS: 56 U/L (ref 39–117)
ALT: 25 U/L (ref 0–35)
AST: 28 U/L (ref 0–37)
Albumin: 4.3 g/dL (ref 3.5–5.2)
BUN: 14 mg/dL (ref 6–23)
CO2: 31 meq/L (ref 19–32)
CREATININE: 0.6 mg/dL (ref 0.4–1.2)
Calcium: 9.7 mg/dL (ref 8.4–10.5)
Chloride: 106 mEq/L (ref 96–112)
GFR: 101.11 mL/min (ref >60.00)
GLUCOSE: 93 mg/dL (ref 70–99)
Potassium: 4.3 mEq/L (ref 3.5–5.1)
Sodium: 141 mEq/L (ref 135–145)
Total Bilirubin: 0.7 mg/dL (ref 0.2–1.2)
Total Protein: 7.3 g/dL (ref 6.0–8.3)

## 2014-05-09 LAB — LIPID PANEL
CHOLESTEROL: 208 mg/dL — AB (ref 0–200)
HDL: 67.3 mg/dL (ref >39.00)
LDL Cholesterol: 121 mg/dL — ABNORMAL HIGH (ref 0–99)
NONHDL: 140.7
TRIGLYCERIDES: 98 mg/dL (ref 0.0–149.0)
Total CHOL/HDL Ratio: 3
VLDL: 19.6 mg/dL (ref 0.0–40.0)

## 2014-05-09 LAB — TSH: TSH: 1.24 u[IU]/mL (ref 0.35–4.50)

## 2014-05-09 LAB — FECAL OCCULT BLOOD, IMMUNOCHEMICAL: Fecal Occult Bld: NEGATIVE

## 2014-05-09 NOTE — Telephone Encounter (Signed)
Pending lab return

## 2014-05-09 NOTE — Telephone Encounter (Signed)
Pt says if today labs can be faxed over to Dr.Jama Drue Novel at [facility] number: [phone]

## 2014-05-10 LAB — HEPATITIS B SURFACE ANTIBODY,QUALITATIVE: Hep B S Ab: POSITIVE — AB

## 2014-05-10 LAB — VITAMIN D 25 HYDROXY (VIT D DEFICIENCY, FRACTURES): Vit D, 25-Hydroxy: 52 ng/mL (ref 30–89)

## 2014-05-10 NOTE — Addendum Note (Signed)
Addended by: Crecencio Mc on: 05/10/2014 01:52 PM   Modules accepted: Orders

## 2014-05-11 ENCOUNTER — Encounter: Payer: Self-pay | Admitting: *Deleted

## 2014-05-11 LAB — VARICELLA ZOSTER ABS, IGG/IGM
VARICELLA: 980 {index} (ref >165)
Varicella IgM: 1.06 index — ABNORMAL HIGH (ref 0.00–0.90)

## 2014-05-11 NOTE — Telephone Encounter (Signed)
Labs faxed as requested

## 2014-05-28 ENCOUNTER — Other Ambulatory Visit: Payer: Self-pay | Admitting: Internal Medicine

## 2014-08-28 ENCOUNTER — Other Ambulatory Visit: Payer: Self-pay | Admitting: Internal Medicine

## 2014-09-06 ENCOUNTER — Ambulatory Visit (INDEPENDENT_AMBULATORY_CARE_PROVIDER_SITE_OTHER): Payer: PRIVATE HEALTH INSURANCE | Admitting: Family Medicine

## 2014-09-06 ENCOUNTER — Encounter: Payer: Self-pay | Admitting: Family Medicine

## 2014-09-06 ENCOUNTER — Ambulatory Visit (INDEPENDENT_AMBULATORY_CARE_PROVIDER_SITE_OTHER)
Admission: RE | Admit: 2014-09-06 | Discharge: 2014-09-06 | Disposition: A | Discharge status: Home / Self Care | Payer: PRIVATE HEALTH INSURANCE | Source: Ambulatory Visit | Attending: Family Medicine | Admitting: Family Medicine

## 2014-09-06 VITALS — BP 116/72 | HR 74 | Temp 98.5°F | Wt 129.5 lb

## 2014-09-06 DIAGNOSIS — Z79899 Other long term (current) drug therapy: Secondary | ICD-10-CM

## 2014-09-06 DIAGNOSIS — M79672 Pain in left foot: Secondary | ICD-10-CM

## 2014-09-06 DIAGNOSIS — R300 Dysuria: Secondary | ICD-10-CM

## 2014-09-06 DIAGNOSIS — M84375A Stress fracture, left foot, initial encounter for fracture: Secondary | ICD-10-CM | POA: Insufficient documentation

## 2014-09-06 LAB — POCT URINALYSIS DIPSTICK
Bilirubin, UA: NEGATIVE
Glucose, UA: NEGATIVE
Ketones, UA: NEGATIVE
Leukocytes, UA: NEGATIVE
Nitrite, UA: NEGATIVE
PROTEIN UA: NEGATIVE
SPEC GRAV UA: 1.01
Urobilinogen, UA: 0.2
pH, UA: 7

## 2014-09-06 LAB — COMPREHENSIVE METABOLIC PANEL
ALT: 20 U/L (ref 0–35)
AST: 24 U/L (ref 0–37)
Albumin: 3.8 g/dL (ref 3.5–5.2)
Alkaline Phosphatase: 56 U/L (ref 39–117)
BILIRUBIN TOTAL: 0.7 mg/dL (ref 0.2–1.2)
BUN: 14 mg/dL (ref 6–23)
CHLORIDE: 105 meq/L (ref 96–112)
CO2: 28 mEq/L (ref 19–32)
CREATININE: 0.7 mg/dL (ref 0.4–1.2)
Calcium: 9.6 mg/dL (ref 8.4–10.5)
GFR: 86.77 mL/min (ref >60.00)
Glucose, Bld: 91 mg/dL (ref 70–99)
Potassium: 4.7 mEq/L (ref 3.5–5.1)
Sodium: 140 mEq/L (ref 135–145)
Total Protein: 7.9 g/dL (ref 6.0–8.3)

## 2014-09-06 MED ORDER — SULFAMETHOXAZOLE-TMP DS 800-160 MG PO TABS: 1.0000 | ORAL_TABLET | Freq: Two times a day (BID) | ORAL | Status: DC

## 2014-09-06 NOTE — Addendum Note (Signed)
Addended by: Ellamae Sia on: 09/06/2014 12:02 PM   Modules accepted: Orders

## 2014-09-06 NOTE — Assessment & Plan Note (Addendum)
Suspicious for MT stress fracture after started jogging. Xray today - possible metatarsal irregularity at 4th MT - will await radiology read. rec post op shoe for next 3-4 weeks and if persistent sxs rec f/u with [place] or foot doctor. Pt agrees with plan. Xray returned positive for stress fracture of 4th MT - discussed with patient. Stressed need for post op shoe for next several weeks. Will refer to Dr. Jackelyn Hoehn as well for f/u appt in 1-2 wks - pt will make appt when she leaves up front.

## 2014-09-06 NOTE — Patient Instructions (Addendum)
Labwork for Dr. Derrel Nip today. Urine looking ok today. Given similar symptoms to prior UTI, I will provide you with a short abx course, but first I want you to increase water intake and cranberry juice to see if symptoms resolve on their own. Avoid caffeine. If not better with this, fill antibiotic. If persistent symptoms, please return to see [place]. For foot - xray today. Will await radiology read. I'd like to place you in a post op shoe for next 3-4 weeks. Avoid jogging for now. If not better with this, please return for further evaluation. Good to meet you today, call us with questions.

## 2014-09-06 NOTE — Progress Notes (Signed)
Pre visit review using our clinic review tool, if applicable. No additional management support is needed unless otherwise documented below in the visit note. 

## 2014-09-06 NOTE — Assessment & Plan Note (Signed)
UA/micro reassuring today. However she endorses sxs similar to prior UTI. Recommended increased water intake, try cranberry juice for proanthocyanidin effect. If not improved, provided with WASP for bactrim 3d course. If persistent sxs despite this, rec return for further evaluation. Pt agrees with plan.

## 2014-09-06 NOTE — Progress Notes (Addendum)
BP 116/72  Pulse 74  Temp(Src) 98.5 F (36.9 C) (Oral)  Wt 129 lb 8 oz (58.741 kg)  SpO2 97%   CC: UTI?  Subjective:    Patient ID: Krista Olson, female    DOB: [birth year], 58 y.o.   MRN: [id number]  HPI: AMA MCMASTER is a 58 y.o. female presenting on 09/06/2014 for Dysuria and Abdominal Pain   Some discomfort with voiding at end of stream over last 1 week, progressively worsening over last 2 days. Increased frequency. No urgency, hematuria, flank pain, nausea, fevers.   H/o UTI in past (about 1 year ago), felt similar.  Could do better with water intake, but not holding urine.  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   By the way - L midfoot discomfort ongoing over last 1 month. No inciting trauma noted. She has started jogging more recently.  Relevant past medical, surgical, family and social history reviewed and updated as indicated.  Allergies and medications reviewed and updated. Current Outpatient Prescriptions on File Prior to Visit  Medication Sig  . atorvastatin (LIPITOR) 10 MG tablet TAKE 1 TABLET BY MOUTH ONCE DAILY.  Marland Kitchen azaTHIOprine (IMURAN) 50 MG tablet Take 50 mg by mouth daily.  . Calcium Carbonate-Vitamin D (CALCIUM + D PO) Take 1 tablet by mouth daily.  . fish oil-omega-3 fatty acids 1000 MG capsule Take 1 capsule by mouth daily.  Marland Kitchen levothyroxine (SYNTHROID, LEVOTHROID) 75 MCG tablet TAKE 1 TABLET BY MOUTH EACH MORNING FOR THYROID. BEST ON EMPTY STOMACH.  . Multiple Vitamin (MULTIVITAMIN) tablet Take 1 tablet by mouth daily.  Marland Kitchen PROGESTERONE VA Progesterone 3% hrt crm. Apply 1/34mL vaginally one to two times daily for 25 days a month. This must be Called in for refills since compunded. This was called in on 02/15/13 with 11 refills.   No current facility-administered medications on file prior to visit.    Review of Systems Per HPI unless specifically indicated above    Objective:    BP 116/72  Pulse 74  Temp(Src) 98.5 F (36.9 C) (Oral)   Wt 129 lb 8 oz (58.741 kg)  SpO2 97%  Physical Exam  Nursing note and vitals reviewed. Constitutional: She appears well-developed and well-nourished. No distress.  Abdominal: Soft. Normal appearance and bowel sounds are normal. She exhibits no distension and no mass. There is no hepatosplenomegaly. There is no tenderness. There is no rebound, no guarding, no CVA tenderness and negative Murphy's sign.  Musculoskeletal: She exhibits no edema.  Point tender to palpation over midshaft of L 4th MT, worse with extension at midfoot. No other pain or swelling 2+ DP. Sensation intact. No pain at sole.       Assessment & Plan:   Problem List Items Addressed This Visit   Left foot pain     Suspicious for MT stress fracture after started jogging. Xray today - possible metatarsal irregularity at 4th MT - will await radiology read. rec post op shoe for next 3-4 weeks and if persistent sxs rec f/u with [place] or foot doctor. Pt agrees with plan. Xray returned positive for stress fracture of 4th MT - discussed with patient. Stressed need for post op shoe for next several weeks. Will refer to Dr. Jackelyn Hoehn as well for f/u appt in 1-2 wks - pt will make appt when she leaves up front.    Relevant Orders      DG Foot Complete Left (Completed)   Dysuria - Primary     UA/micro  reassuring today. However she endorses sxs similar to prior UTI. Recommended increased water intake, try cranberry juice for proanthocyanidin effect. If not improved, provided with WASP for bactrim 3d course. If persistent sxs despite this, rec return for further evaluation. Pt agrees with plan.    Relevant Orders      POCT urinalysis dipstick (Completed)       Follow up plan: Return if symptoms worsen or fail to improve.

## 2014-09-07 ENCOUNTER — Encounter: Payer: Self-pay | Admitting: *Deleted

## 2014-09-12 ENCOUNTER — Encounter: Payer: Self-pay | Admitting: Internal Medicine

## 2014-09-24 ENCOUNTER — Institutional Professional Consult (permissible substitution): Payer: PRIVATE HEALTH INSURANCE | Admitting: Family Medicine

## 2014-09-26 ENCOUNTER — Ambulatory Visit (INDEPENDENT_AMBULATORY_CARE_PROVIDER_SITE_OTHER): Payer: PRIVATE HEALTH INSURANCE | Admitting: Family Medicine

## 2014-09-26 ENCOUNTER — Encounter: Payer: Self-pay | Admitting: Family Medicine

## 2014-09-26 VITALS — BP 112/70 | HR 62 | Temp 98.3°F | Ht 62.0 in | Wt 128.5 lb

## 2014-09-26 DIAGNOSIS — M84375D Stress fracture, left foot, subsequent encounter for fracture with routine healing: Secondary | ICD-10-CM

## 2014-09-26 NOTE — Progress Notes (Signed)
Dr. Frederico Hamman T. Maritssa Haughton, MD, [facility] [place], [address] Phone: [phone] Fax: [phone]  09/26/2014  Patient: Krista Olson, MRN: [id number], DOB: 1956/10/07, 58 y.o.  Primary Physician:  Deborra Medina, MD  Chief Complaint: Stress fracture  Subjective:   Dear Dr. Danise Mina:  Thank you for having me see Krista Olson in consultation today at [facility] at [facility] for her problem with left foot pain.  As you may recall, she is a 58 y.o. year old female with a history of indolent onset of left forefoot pain after she began running approximately 2 months ago. The patient is a postmenopausal 58 year old female with a BMI of 23. She was able to run up to approximately 3/4 of a mile at a time.  She started to develop some pain in her forefoot, without a specific point of injury, and her x-rays show some clear changes would be associated with a metatarsal stress fracture. She and I pulled up her x-rays together and reviewed them in the office. Her definitely some periosteal reaction in the distal fourth metatarsal, which corresponds to the patient's point of maximal pain.  She has been wearing a postoperative shoe, but she has been relatively noncompliant, since her foot has been feeling better.   Past Medical History  Diagnosis Date  . Hepatitis, autoimmune   . Thyroid disease    Past Surgical History  Procedure Laterality Date  . Percutaneous liver biopsy     History   Social History  . Marital Status: Married    Spouse Name: N/A    Number of Children: N/A  . Years of Education: N/A   Social History Main Topics  . Smoking status: Never Smoker   . Smokeless tobacco: Never Used  . Alcohol Use: 0.0 oz/week    0 Not specified per week     Comment: occ  . Drug Use: No  . Sexual Activity: None   Other Topics Concern  . None   Social History Narrative   Family History  Problem Relation Age of  Onset  . Goiter Mother   . Heart disease Mother 31    died during valve replacement  . Kidney disease Father   . Cancer Brother 48    renal cell Ca  . Heart disease Brother   . Cancer Maternal Aunt 79    breast cancer , 1of 7 sisters only    Medications and Allergies reviewed   GEN: No fevers, chills. Nontoxic. Primarily MSK c/o today. MSK: Detailed in the HPI GI: tolerating PO intake without difficulty Neuro: No numbness, parasthesias, or tingling associated. Otherwise the pertinent positives of the ROS are noted above.   Objective:   BP 112/70 mmHg  Pulse 62  Temp(Src) 98.3 F (36.8 C) (Oral)  Ht 5\' 2"  (1.575 m)  Wt 128 lb 8 oz (58.287 kg)  BMI 23.50 kg/m2    GEN: WDWN, NAD, Non-toxic, Alert & Oriented x 3 HEENT: Atraumatic, Normocephalic.  Ears and Nose: No external deformity. EXTR: No clubbing/cyanosis/edema NEURO: Normal gait.  PSYCH: Normally interactive. Conversant. Not depressed or anxious appearing.  Calm demeanor.   FEET: L Echymosis: no Edema: no ROM: full LE B Gait: heel toe, non-antalgic MT pain: no Callus pattern: none Lateral Mall: NT Medial Mall: NT Talus: NT Navicular: NT Cuboid: NT Calcaneous: NT Metatarsals: distal 4th MT mildly TTP 5th MT: NT Phalanges: NT Achilles: NT Plantar Fascia: NT Fat Pad: NT Peroneals:  NT Post Tib: NT Great Toe: Nml motion Ant Drawer: neg ATFL: NT CFL: NT Deltoid: NT Sensation: intact   Radiology:  Dg Foot Complete Left  09/06/2014   CLINICAL DATA:  Pain left fourth metatarsal region, acute onset  EXAM: LEFT FOOT - COMPLETE 3+ VIEW  COMPARISON:  None.  FINDINGS: Frontal, oblique, and lateral views were obtained. There is subtle sclerosis with periosteal reaction in the distal fourth metatarsal diaphysis consistent with a stress type fracture. No other evidence of fracture. No dislocation. Joint spaces appear intact. No erosive change.  IMPRESSION: Subtle sclerosis and periosteal reaction in the distal  fourth metatarsal diaphysis consistent with a stress type fracture. Alignment anatomic. No other evidence of fracture. No dislocation. No appreciable arthropathy.  These results will be called to the ordering clinician or representative by the Radiologist Assistant, and communication documented in the PACS or zVision Dashboard.   Electronically Signed   By: Lowella Grip M.D.   On: 09/06/2014 10:09    Assessment and Plan:   Metatarsal stress fracture of left foot, with routine healing, subsequent encounter   She is making great progress. I urged her compliance, and recommended that she continue wearing her postoperative shoe for an additional 14 days. At that point, she should be doing quite well.  Gradual return to sport and running  I appreciate the opportunity to evaluate this very friendly patient. If you have any question regarding her care or prognosis, do not hesitate to ask.    Follow-up: if pain returns or persists at any point.   New Prescriptions   No medications on file   No orders of the defined types were placed in this encounter.    We will see the patient back in No Follow-up on file.. If not noted, then follow-up as needed.   Thank you for having [place] see Krista Olson in consultation.  Feel free to contact me with any questions.  Signed,  Maud Deed. Abijah Roussel, MD   Patient's Medications  New Prescriptions   No medications on file  Previous Medications   ATORVASTATIN (LIPITOR) 10 MG TABLET    TAKE 1 TABLET BY MOUTH ONCE DAILY.   AZATHIOPRINE (IMURAN) 50 MG TABLET    Take 50 mg by mouth daily.   CALCIUM CARBONATE-VITAMIN D (CALCIUM + D PO)    Take 1 tablet by mouth daily.   FISH OIL-OMEGA-3 FATTY ACIDS 1000 MG CAPSULE    Take 1 capsule by mouth daily.   LEVOTHYROXINE (SYNTHROID, LEVOTHROID) 75 MCG TABLET    TAKE 1 TABLET BY MOUTH EACH MORNING FOR THYROID. BEST ON EMPTY STOMACH.   MULTIPLE VITAMIN (MULTIVITAMIN) TABLET    Take 1 tablet by mouth daily.    PROGESTERONE VA    Progesterone 3% hrt crm. Apply 1/101mL vaginally one to two times daily for 25 days a month. This must be Called in for refills since compunded. This was called in on 02/15/13 with 11 refills.  Modified Medications   No medications on file  Discontinued Medications   SULFAMETHOXAZOLE-TRIMETHOPRIM (BACTRIM DS) 800-160 MG PER TABLET    Take 1 tablet by mouth 2 (two) times daily.

## 2014-09-26 NOTE — Progress Notes (Signed)
Pre visit review using our clinic review tool, if applicable. No additional management support is needed unless otherwise documented below in the visit note. 

## 2015-01-02 ENCOUNTER — Other Ambulatory Visit: Payer: Self-pay | Admitting: Internal Medicine

## 2015-03-04 ENCOUNTER — Telehealth: Payer: Self-pay | Admitting: *Deleted

## 2015-03-04 DIAGNOSIS — Z79899 Other long term (current) drug therapy: Secondary | ICD-10-CM

## 2015-03-04 DIAGNOSIS — E785 Hyperlipidemia, unspecified: Secondary | ICD-10-CM

## 2015-03-04 DIAGNOSIS — E034 Atrophy of thyroid (acquired): Secondary | ICD-10-CM

## 2015-03-04 NOTE — Telephone Encounter (Signed)
Pt coming in tomorrow what labs and dx?  

## 2015-03-05 ENCOUNTER — Encounter: Payer: PRIVATE HEALTH INSURANCE | Admitting: Internal Medicine

## 2015-03-05 ENCOUNTER — Other Ambulatory Visit (INDEPENDENT_AMBULATORY_CARE_PROVIDER_SITE_OTHER): Payer: PRIVATE HEALTH INSURANCE

## 2015-03-05 DIAGNOSIS — E038 Other specified hypothyroidism: Secondary | ICD-10-CM | POA: Diagnosis not present

## 2015-03-05 DIAGNOSIS — E785 Hyperlipidemia, unspecified: Secondary | ICD-10-CM | POA: Diagnosis not present

## 2015-03-05 DIAGNOSIS — Z79899 Other long term (current) drug therapy: Secondary | ICD-10-CM

## 2015-03-05 DIAGNOSIS — E034 Atrophy of thyroid (acquired): Secondary | ICD-10-CM | POA: Diagnosis not present

## 2015-03-05 LAB — COMPREHENSIVE METABOLIC PANEL
ALBUMIN: 4.5 g/dL (ref 3.5–5.2)
ALT: 21 U/L (ref 0–35)
AST: 23 U/L (ref 0–37)
Alkaline Phosphatase: 67 U/L (ref 39–117)
BUN: 16 mg/dL (ref 6–23)
CALCIUM: 10.1 mg/dL (ref 8.4–10.5)
CHLORIDE: 102 meq/L (ref 96–112)
CO2: 31 mEq/L (ref 19–32)
Creatinine, Ser: 0.7 mg/dL (ref 0.40–1.20)
GFR: 90.92 mL/min (ref >60.00)
GLUCOSE: 95 mg/dL (ref 70–99)
POTASSIUM: 4.7 meq/L (ref 3.5–5.1)
SODIUM: 138 meq/L (ref 135–145)
Total Bilirubin: 0.7 mg/dL (ref 0.2–1.2)
Total Protein: 7.5 g/dL (ref 6.0–8.3)

## 2015-03-05 LAB — TSH: TSH: 1.51 u[IU]/mL (ref 0.35–4.50)

## 2015-03-05 LAB — LIPID PANEL
CHOLESTEROL: 219 mg/dL — AB (ref 0–200)
HDL: 67.7 mg/dL (ref >39.00)
LDL Cholesterol: 129 mg/dL — ABNORMAL HIGH (ref 0–99)
NonHDL: 151.3
TRIGLYCERIDES: 113 mg/dL (ref 0.0–149.0)
Total CHOL/HDL Ratio: 3
VLDL: 22.6 mg/dL (ref 0.0–40.0)

## 2015-03-05 NOTE — Telephone Encounter (Signed)
Sorry , labs ordered now

## 2015-03-05 NOTE — Telephone Encounter (Signed)
Pt would like a liver panel

## 2015-03-05 NOTE — Addendum Note (Signed)
Addended by: Crecencio Mc on: 03/05/2015 11:20 AM   Modules accepted: Orders

## 2015-03-12 ENCOUNTER — Encounter: Payer: Self-pay | Admitting: Internal Medicine

## 2015-03-12 ENCOUNTER — Ambulatory Visit (INDEPENDENT_AMBULATORY_CARE_PROVIDER_SITE_OTHER): Payer: PRIVATE HEALTH INSURANCE | Admitting: Internal Medicine

## 2015-03-12 VITALS — BP 114/78 | HR 75 | Temp 98.5°F | Resp 16 | Ht 62.5 in | Wt 131.5 lb

## 2015-03-12 DIAGNOSIS — E785 Hyperlipidemia, unspecified: Secondary | ICD-10-CM

## 2015-03-12 DIAGNOSIS — Z0000 Encounter for general adult medical examination without abnormal findings: Secondary | ICD-10-CM | POA: Diagnosis not present

## 2015-03-12 DIAGNOSIS — Z23 Encounter for immunization: Secondary | ICD-10-CM | POA: Diagnosis not present

## 2015-03-12 DIAGNOSIS — M84375S Stress fracture, left foot, sequela: Secondary | ICD-10-CM

## 2015-03-12 DIAGNOSIS — K754 Autoimmune hepatitis: Secondary | ICD-10-CM

## 2015-03-12 MED ORDER — BIEST/PROGESTERONE TD CREA: 1.0000 "application " | TOPICAL_CREAM | Freq: Every day | TRANSDERMAL | Status: DC

## 2015-03-12 NOTE — Progress Notes (Signed)
Pre-visit discussion using our clinic review tool. No additional management support is needed unless otherwise documented below in the visit note.  

## 2015-03-12 NOTE — Patient Instructions (Addendum)
Your liver enzymes were normal and we will send them to Dr Drue Novel.    You received the Pneumonia vaccine today  You can schedule your mammogram using the order I have signed   I recommend getting the half of your calcium and Vitamin D  Requirements through dietary sources rather than supplements given the recent association of calcium supplements with increased coronary artery calcium scores (You need 1800 mg daily )   Unsweetened almond/coconut milk, Cashew and soy  Milks are all great low calorie low carb, cholesterol free sources of dietary calcium and vitamin D.    Health Maintenance Adopting a healthy lifestyle and getting preventive care can go a long way to promote health and wellness. Talk with your health care provider about what schedule of regular examinations is right for you. This is a good chance for you to check in with your provider about disease prevention and staying healthy. In between checkups, there are plenty of things you can do on your own. Experts have done a lot of research about which lifestyle changes and preventive measures are most likely to keep you healthy. Ask your health care provider for more information. WEIGHT AND DIET  Eat a healthy diet  Be sure to include plenty of vegetables, fruits, low-fat dairy products, and lean protein.  Do not eat a lot of foods high in solid fats, added sugars, or salt.  Get regular exercise. This is one of the most important things you can do for your health.  Most adults should exercise for at least 150 minutes each week. The exercise should increase your heart rate and make you sweat (moderate-intensity exercise).  Most adults should also do strengthening exercises at least twice a week. This is in addition to the moderate-intensity exercise.  Maintain a healthy weight  Body mass index (BMI) is a measurement that can be used to identify possible weight problems. It estimates body fat based on height and weight. Your  health care provider can help determine your BMI and help you achieve or maintain a healthy weight.  For females 80 years of age and older:   A BMI below 18.5 is considered underweight.  A BMI of 18.5 to 24.9 is normal.  A BMI of 25 to 29.9 is considered overweight.  A BMI of 30 and above is considered obese.  Watch levels of cholesterol and blood lipids  You should start having your blood tested for lipids and cholesterol at 60 years of age, then have this test every 5 years.  You may need to have your cholesterol levels checked more often if:  Your lipid or cholesterol levels are high.  You are older than 60 years of age.  You are at high risk for heart disease.  CANCER SCREENING   Lung Cancer  Lung cancer screening is recommended for adults 93-63 years old who are at high risk for lung cancer because of a history of smoking.  A yearly low-dose CT scan of the lungs is recommended for people who:  Currently smoke.  Have quit within the past 15 years.  Have at least a 30-pack-year history of smoking. A pack year is smoking an average of one pack of cigarettes a day for 1 year.  Yearly screening should continue until it has been 15 years since you quit.  Yearly screening should stop if you develop a health problem that would prevent you from having lung cancer treatment.  Breast Cancer  Practice breast self-awareness. This means understanding how  your breasts normally appear and feel.  It also means doing regular breast self-exams. Let your health care provider know about any changes, no matter how small.  If you are in your 20s or 30s, you should have a clinical breast exam (CBE) by a health care provider every 1-3 years as part of a regular health exam.  If you are 78 or older, have a CBE every year. Also consider having a breast X-ray (mammogram) every year.  If you have a family history of breast cancer, talk to your health care provider about genetic  screening.  If you are at high risk for breast cancer, talk to your health care provider about having an MRI and a mammogram every year.  Breast cancer gene (BRCA) assessment is recommended for women who have family members with BRCA-related cancers. BRCA-related cancers include:  Breast.  Ovarian.  Tubal.  Peritoneal cancers.  Results of the assessment will determine the need for genetic counseling and BRCA1 and BRCA2 testing. Cervical Cancer Routine pelvic examinations to screen for cervical cancer are no longer recommended for nonpregnant women who are considered low risk for cancer of the pelvic organs (ovaries, uterus, and vagina) and who do not have symptoms. A pelvic examination may be necessary if you have symptoms including those associated with pelvic infections. Ask your health care provider if a screening pelvic exam is right for you.   The Pap test is the screening test for cervical cancer for women who are considered at risk.  If you had a hysterectomy for a problem that was not cancer or a condition that could lead to cancer, then you no longer need Pap tests.  If you are older than 65 years, and you have had normal Pap tests for the past 10 years, you no longer need to have Pap tests.  If you have had past treatment for cervical cancer or a condition that could lead to cancer, you need Pap tests and screening for cancer for at least 20 years after your treatment.  If you no longer get a Pap test, assess your risk factors if they change (such as having a new sexual partner). This can affect whether you should start being screened again.  Some women have medical problems that increase their chance of getting cervical cancer. If this is the case for you, your health care provider may recommend more frequent screening and Pap tests.  The human papillomavirus (HPV) test is another test that may be used for cervical cancer screening. The HPV test looks for the virus that can  cause cell changes in the cervix. The cells collected during the Pap test can be tested for HPV.  The HPV test can be used to screen women 4 years of age and older. Getting tested for HPV can extend the interval between normal Pap tests from three to five years.  An HPV test also should be used to screen women of any age who have unclear Pap test results.  After 60 years of age, women should have HPV testing as often as Pap tests.  Colorectal Cancer  This type of cancer can be detected and often prevented.  Routine colorectal cancer screening usually begins at 61 years of age and continues through 60 years of age.  Your health care provider may recommend screening at an earlier age if you have risk factors for colon cancer.  Your health care provider may also recommend using home test kits to check for hidden blood in the  stool.  A small camera at the end of a tube can be used to examine your colon directly (sigmoidoscopy or colonoscopy). This is done to check for the earliest forms of colorectal cancer.  Routine screening usually begins at age 4.  Direct examination of the colon should be repeated every 5-10 years through 60 years of age. However, you may need to be screened more often if early forms of precancerous polyps or small growths are found. Skin Cancer  Check your skin from head to toe regularly.  Tell your health care provider about any new moles or changes in moles, especially if there is a change in a mole's shape or color.  Also tell your health care provider if you have a mole that is larger than the size of a pencil eraser.  Always use sunscreen. Apply sunscreen liberally and repeatedly throughout the day.  Protect yourself by wearing long sleeves, pants, a wide-brimmed hat, and sunglasses whenever you are outside. HEART DISEASE, DIABETES, AND HIGH BLOOD PRESSURE   Have your blood pressure checked at least every 1-2 years. High blood pressure causes heart  disease and increases the risk of stroke.  If you are between 74 years and 44 years old, ask your health care provider if you should take aspirin to prevent strokes.  Have regular diabetes screenings. This involves taking a blood sample to check your fasting blood sugar level.  If you are at a normal weight and have a low risk for diabetes, have this test once every three years after 60 years of age.  If you are overweight and have a high risk for diabetes, consider being tested at a younger age or more often. PREVENTING INFECTION  Hepatitis B  If you have a higher risk for hepatitis B, you should be screened for this virus. You are considered at high risk for hepatitis B if:  You were born in a country where hepatitis B is common. Ask your health care provider which countries are considered high risk.  Your parents were born in a high-risk country, and you have not been immunized against hepatitis B (hepatitis B vaccine).  You have HIV or AIDS.  You use needles to inject street drugs.  You live with someone who has hepatitis B.  You have had sex with someone who has hepatitis B.  You get hemodialysis treatment.  You take certain medicines for conditions, including cancer, organ transplantation, and autoimmune conditions. Hepatitis C  Blood testing is recommended for:  Everyone born from [birth year] through 1966.  Anyone with known risk factors for hepatitis C. Sexually transmitted infections (STIs)  You should be screened for sexually transmitted infections (STIs) including gonorrhea and chlamydia if:  You are sexually active and are younger than 60 years of age.  You are older than 60 years of age and your health care provider tells you that you are at risk for this type of infection.  Your sexual activity has changed since you were last screened and you are at an increased risk for chlamydia or gonorrhea. Ask your health care provider if you are at risk.  If you do not have  HIV, but are at risk, it may be recommended that you take a prescription medicine daily to prevent HIV infection. This is called pre-exposure prophylaxis (PrEP). You are considered at risk if:  You are sexually active and do not regularly use condoms or know the HIV status of your partner(s).  You take drugs by injection.  You are  sexually active with a partner who has HIV. Talk with your health care provider about whether you are at high risk of being infected with HIV. If you choose to begin PrEP, you should first be tested for HIV. You should then be tested every 3 months for as long as you are taking PrEP.  PREGNANCY   If you are premenopausal and you may become pregnant, ask your health care provider about preconception counseling.  If you may become pregnant, take 400 to 800 micrograms (mcg) of folic acid every day.  If you want to prevent pregnancy, talk to your health care provider about birth control (contraception). OSTEOPOROSIS AND MENOPAUSE   Osteoporosis is a disease in which the bones lose minerals and strength with aging. This can result in serious bone fractures. Your risk for osteoporosis can be identified using a bone density scan.  If you are 69 years of age or older, or if you are at risk for osteoporosis and fractures, ask your health care provider if you should be screened.  Ask your health care provider whether you should take a calcium or vitamin D supplement to lower your risk for osteoporosis.  Menopause may have certain physical symptoms and risks.  Hormone replacement therapy may reduce some of these symptoms and risks. Talk to your health care provider about whether hormone replacement therapy is right for you.  HOME CARE INSTRUCTIONS   Schedule regular health, dental, and eye exams.  Stay current with your immunizations.   Do not use any tobacco products including cigarettes, chewing tobacco, or electronic cigarettes.  If you are pregnant, do not  drink alcohol.  If you are breastfeeding, limit how much and how often you drink alcohol.  Limit alcohol intake to no more than 1 drink per day for nonpregnant women. One drink equals 12 ounces of beer, 5 ounces of wine, or 1 ounces of hard liquor.  Do not use street drugs.  Do not share needles.  Ask your health care provider for help if you need support or information about quitting drugs.  Tell your health care provider if you often feel depressed.  Tell your health care provider if you have ever been abused or do not feel safe at home. Document Released: 05/25/2011 Document Revised: 03/26/2014 Document Reviewed: 10/11/2013 [facility] Patient Information 2015 [place], [place]. This information is not intended to replace advice given to you by your health care provider. Make sure you discuss any questions you have with your health care provider.

## 2015-03-12 NOTE — Progress Notes (Signed)
Patient ID: Krista Olson, female   DOB: 11/01/1956, 59 y.o.   MRN: [id number]  Subjective:     Krista Olson is a 59 y.o. female and is here for a comprehensive physical exam. The patient reports no problems.  She was last seen over one year ago,  In the interim she has stopped running,  Due to multiple orthopedic issues.  1) chronic pain in lower back and hip on the left.  Doing some walking and yoga.  Relieved by chiropractic manipulaiton and yoga. 2( Had a metatarsal stress fracture left foot secondary to running In November, treated by Krista Olson.  She continues to have follow up on autoimmune hepatitis prove with biopsy showing fibrosis,  Managed with Imuran by [facility] specialist,  Krista Olson. Recent liver enzyems were done here and are reported as normal..   History   Social History  . Marital Status: Married    Spouse Name: N/A  . Number of Children: N/A  . Years of Education: N/A   Occupational History  . Not on file.   Social History Main Topics  . Smoking status: Never Smoker   . Smokeless tobacco: Never Used  . Alcohol Use: 3.0 oz/week    0 Standard drinks or equivalent, 5 Glasses of wine per week     Comment: occ  . Drug Use: No  . Sexual Activity: Not on file   Other Topics Concern  . Not on file   Social History Narrative   Health Maintenance  Topic Date Due  . HIV Screening  10/25/1970  . INFLUENZA VACCINE  06/24/2015  . PAP SMEAR  02/04/2016  . MAMMOGRAM  03/11/2016  . COLONOSCOPY  12/06/2016  . TETANUS/TDAP  02/02/2024    The following portions of the patient's history were reviewed and updated as appropriate: allergies, current medications, past family history, past medical history, past social history, past surgical history and problem list.  Review of Systems  Patient denies headache, fevers, malaise, unintentional weight loss, skin rash, eye pain, sinus congestion and sinus pain, sore throat, dysphagia,  hemoptysis , cough, dyspnea,  wheezing, chest pain, palpitations, orthopnea, edema, abdominal pain, nausea, melena, diarrhea, constipation, flank pain, dysuria, hematuria, urinary  Frequency, nocturia, numbness, tingling, seizures,  Focal weakness, Loss of consciousness,  Tremor, insomnia, depression, anxiety, and suicidal ideation.      Objective:   BP 114/78 mmHg  Pulse 75  Temp(Src) 98.5 F (36.9 C) (Oral)  Resp 16  Ht 5' 2.5" (1.588 m)  Wt 131 lb 8 oz (59.648 kg)  BMI 23.65 kg/m2  SpO2 97%  General appearance: alert, cooperative and appears stated age Head: Normocephalic, without obvious abnormality, atraumatic Eyes: conjunctivae/corneas clear. PERRL, EOM's intact. Fundi benign. Ears: normal TM's and external ear canals both ears Nose: Nares normal. Septum midline. Mucosa normal. No drainage or sinus tenderness. Throat: lips, mucosa, and tongue normal; teeth and gums normal Neck: no adenopathy, no carotid bruit, no JVD, supple, symmetrical, trachea midline and thyroid not enlarged, symmetric, no tenderness/mass/nodules Lungs: clear to auscultation bilaterally Breasts: normal appearance, no masses or tenderness Heart: regular rate and rhythm, S1, S2 normal, no murmur, click, rub or gallop Abdomen: soft, non-tender; bowel sounds normal; no masses,  no organomegaly Extremities: extremities normal, atraumatic, no cyanosis or edema Pulses: 2+ and symmetric Skin: Skin color, texture, turgor normal. No rashes or lesions Neurologic: Alert and oriented X 3, normal strength and tone. Normal symmetric reflexes. Normal coordination and gait.    Assessment and Plan:  Problem List Items Addressed This Visit    Autoimmune hepatitis    Treated with Imuran x 3 years.  Repeat liver enzymes are normal.  Lab Results  Component Value Date   ALT 21 03/05/2015   AST 23 03/05/2015   ALKPHOS 67 03/05/2015   BILITOT 0.7 03/05/2015         Hyperlipidemia LDL goal <160    Historically Well controlled on current  regimen. Liver function stable, no changes today.  Lab Results  Component Value Date   CHOL 219* 03/05/2015   HDL 67.70 03/05/2015   LDLCALC 129* 03/05/2015   LDLDIRECT 122.8 06/14/2012   TRIG 113.0 03/05/2015   CHOLHDL 3 03/05/2015             Routine general medical examination at a health care facility - Primary    Annual wellness  exam was done as well as a comprehensive physical exam and management of acute and chronic conditions .  During the course of the visit the patient was educated and counseled about appropriate screening and preventive services including :  diabetes screening, lipid analysis with projected  10 year  risk for CAD , nutrition counseling, colorectal cancer screening, and recommended immunizations.  Printed recommendations for health maintenance screenings was given.       Metatarsal stress fracture of left foot    Secondary to running, which she has stopped doing.  Her symptoms have resolved with use of post op shoe and behavior modification.  Repeat x rays have not been done.        Other Visit Diagnoses    Need for prophylactic vaccination against Streptococcus pneumoniae (pneumococcus)        Relevant Orders    Pneumococcal polysaccharide vaccine 23-valent greater than or equal to 2yo subcutaneous/IM (Completed)

## 2015-03-13 ENCOUNTER — Encounter: Payer: Self-pay | Admitting: Internal Medicine

## 2015-03-13 NOTE — Progress Notes (Signed)
Faxed lab results as requested by patient to Dr. Derrill Kay to [phone] labs dated 03/05/15.

## 2015-03-14 ENCOUNTER — Encounter: Payer: Self-pay | Admitting: Internal Medicine

## 2015-03-14 NOTE — Assessment & Plan Note (Signed)
Treated with Imuran x 3 years.  Repeat liver enzymes are normal.  Lab Results  Component Value Date   ALT 21 03/05/2015   AST 23 03/05/2015   ALKPHOS 67 03/05/2015   BILITOT 0.7 03/05/2015

## 2015-03-14 NOTE — Assessment & Plan Note (Signed)
Secondary to running, which she has stopped doing.  Her symptoms have resolved with use of post op shoe and behavior modification.  Repeat x rays have not been done.

## 2015-03-14 NOTE — Assessment & Plan Note (Signed)

## 2015-03-14 NOTE — Assessment & Plan Note (Signed)
Historically Well controlled on current regimen. Liver function stable, no changes today.  Lab Results  Component Value Date   CHOL 219* 03/05/2015   HDL 67.70 03/05/2015   LDLCALC 129* 03/05/2015   LDLDIRECT 122.8 06/14/2012   TRIG 113.0 03/05/2015   CHOLHDL 3 03/05/2015

## 2015-03-19 ENCOUNTER — Other Ambulatory Visit: Payer: Self-pay | Admitting: Internal Medicine

## 2015-03-19 DIAGNOSIS — Z1231 Encounter for screening mammogram for malignant neoplasm of breast: Secondary | ICD-10-CM

## 2015-04-03 ENCOUNTER — Other Ambulatory Visit: Payer: Self-pay | Admitting: Internal Medicine

## 2015-04-03 ENCOUNTER — Ambulatory Visit
Admission: RE | Admit: 2015-04-03 | Discharge: 2015-04-03 | Disposition: A | Discharge status: Home / Self Care | Payer: PRIVATE HEALTH INSURANCE | Source: Ambulatory Visit | Attending: Internal Medicine | Admitting: Internal Medicine

## 2015-04-03 DIAGNOSIS — Z1231 Encounter for screening mammogram for malignant neoplasm of breast: Secondary | ICD-10-CM

## 2015-05-17 ENCOUNTER — Other Ambulatory Visit: Payer: PRIVATE HEALTH INSURANCE

## 2015-05-20 ENCOUNTER — Encounter: Payer: Self-pay | Admitting: Internal Medicine

## 2015-05-20 ENCOUNTER — Other Ambulatory Visit: Payer: Self-pay | Admitting: *Deleted

## 2015-05-20 ENCOUNTER — Other Ambulatory Visit (INDEPENDENT_AMBULATORY_CARE_PROVIDER_SITE_OTHER): Payer: PRIVATE HEALTH INSURANCE

## 2015-05-20 ENCOUNTER — Other Ambulatory Visit: Payer: PRIVATE HEALTH INSURANCE

## 2015-05-20 DIAGNOSIS — Z1211 Encounter for screening for malignant neoplasm of colon: Secondary | ICD-10-CM

## 2015-05-20 LAB — FECAL OCCULT BLOOD, IMMUNOCHEMICAL: Fecal Occult Bld: NEGATIVE

## 2015-06-14 NOTE — Telephone Encounter (Signed)
Mailed unread my chart message to patient

## 2015-08-01 ENCOUNTER — Other Ambulatory Visit: Payer: Self-pay | Admitting: Internal Medicine

## 2015-08-28 ENCOUNTER — Telehealth: Payer: Self-pay | Admitting: *Deleted

## 2015-08-28 ENCOUNTER — Other Ambulatory Visit: Payer: Self-pay | Admitting: Internal Medicine

## 2015-08-28 DIAGNOSIS — E785 Hyperlipidemia, unspecified: Secondary | ICD-10-CM

## 2015-08-28 DIAGNOSIS — R5383 Other fatigue: Secondary | ICD-10-CM

## 2015-08-28 DIAGNOSIS — Z113 Encounter for screening for infections with a predominantly sexual mode of transmission: Secondary | ICD-10-CM

## 2015-08-28 DIAGNOSIS — E559 Vitamin D deficiency, unspecified: Secondary | ICD-10-CM

## 2015-08-28 DIAGNOSIS — E034 Atrophy of thyroid (acquired): Secondary | ICD-10-CM

## 2015-08-28 NOTE — Telephone Encounter (Signed)
Labs ordered.  Should do them 2 days ahead of appt.

## 2015-08-28 NOTE — Telephone Encounter (Signed)
Patient stated that she was to come for lab, however, wasn't sure if she should  schedule a lab or an office visit first. Patient stated that she has a six month draw for cholesterol and liver levels. Please advise how to schedule patient

## 2015-09-03 ENCOUNTER — Other Ambulatory Visit: Payer: Self-pay | Admitting: Internal Medicine

## 2015-09-17 ENCOUNTER — Other Ambulatory Visit (INDEPENDENT_AMBULATORY_CARE_PROVIDER_SITE_OTHER): Payer: PRIVATE HEALTH INSURANCE

## 2015-09-17 ENCOUNTER — Telehealth: Payer: Self-pay | Admitting: *Deleted

## 2015-09-17 DIAGNOSIS — E785 Hyperlipidemia, unspecified: Secondary | ICD-10-CM

## 2015-09-17 DIAGNOSIS — Z113 Encounter for screening for infections with a predominantly sexual mode of transmission: Secondary | ICD-10-CM

## 2015-09-17 DIAGNOSIS — R5383 Other fatigue: Secondary | ICD-10-CM

## 2015-09-17 DIAGNOSIS — E038 Other specified hypothyroidism: Secondary | ICD-10-CM

## 2015-09-17 DIAGNOSIS — E034 Atrophy of thyroid (acquired): Secondary | ICD-10-CM

## 2015-09-17 DIAGNOSIS — E559 Vitamin D deficiency, unspecified: Secondary | ICD-10-CM

## 2015-09-17 LAB — COMPREHENSIVE METABOLIC PANEL
ALK PHOS: 58 U/L (ref 39–117)
ALT: 19 U/L (ref 0–35)
AST: 22 U/L (ref 0–37)
Albumin: 4.4 g/dL (ref 3.5–5.2)
BILIRUBIN TOTAL: 0.5 mg/dL (ref 0.2–1.2)
BUN: 13 mg/dL (ref 6–23)
CALCIUM: 10 mg/dL (ref 8.4–10.5)
CO2: 27 meq/L (ref 19–32)
Chloride: 105 mEq/L (ref 96–112)
Creatinine, Ser: 0.65 mg/dL (ref 0.40–1.20)
GFR: 98.86 mL/min (ref >60.00)
Glucose, Bld: 94 mg/dL (ref 70–99)
POTASSIUM: 4.6 meq/L (ref 3.5–5.1)
Sodium: 141 mEq/L (ref 135–145)
TOTAL PROTEIN: 7.7 g/dL (ref 6.0–8.3)

## 2015-09-17 LAB — LIPID PANEL
CHOLESTEROL: 207 mg/dL — AB (ref 0–200)
HDL: 59.3 mg/dL (ref >39.00)
LDL Cholesterol: 125 mg/dL — ABNORMAL HIGH (ref 0–99)
NonHDL: 148.16
TRIGLYCERIDES: 114 mg/dL (ref 0.0–149.0)
Total CHOL/HDL Ratio: 3
VLDL: 22.8 mg/dL (ref 0.0–40.0)

## 2015-09-17 LAB — HEPATITIS C ANTIBODY: HCV Ab: NEGATIVE

## 2015-09-17 LAB — TSH: TSH: 1.08 u[IU]/mL (ref 0.35–4.50)

## 2015-09-17 LAB — VITAMIN D 25 HYDROXY (VIT D DEFICIENCY, FRACTURES): VITD: 33.34 ng/mL (ref 30.00–100.00)

## 2015-09-17 NOTE — Telephone Encounter (Signed)
Pt wants labs to be sent to [facility] at [facility]

## 2015-09-17 NOTE — Telephone Encounter (Signed)
Noted can be viewed through care every where.

## 2015-09-18 LAB — HIV ANTIBODY (ROUTINE TESTING W REFLEX): HIV: NONREACTIVE

## 2015-09-19 ENCOUNTER — Encounter: Payer: Self-pay | Admitting: Internal Medicine

## 2015-09-23 NOTE — Telephone Encounter (Signed)
Labs sent as requested,

## 2015-09-25 ENCOUNTER — Ambulatory Visit (INDEPENDENT_AMBULATORY_CARE_PROVIDER_SITE_OTHER): Payer: PRIVATE HEALTH INSURANCE | Admitting: Internal Medicine

## 2015-09-25 ENCOUNTER — Encounter: Payer: Self-pay | Admitting: Internal Medicine

## 2015-09-25 VITALS — BP 116/70 | HR 69 | Temp 98.3°F | Resp 12 | Ht 63.0 in | Wt 133.5 lb

## 2015-09-25 DIAGNOSIS — K754 Autoimmune hepatitis: Secondary | ICD-10-CM | POA: Diagnosis not present

## 2015-09-25 DIAGNOSIS — E038 Other specified hypothyroidism: Secondary | ICD-10-CM

## 2015-09-25 DIAGNOSIS — E785 Hyperlipidemia, unspecified: Secondary | ICD-10-CM | POA: Diagnosis not present

## 2015-09-25 DIAGNOSIS — E034 Atrophy of thyroid (acquired): Secondary | ICD-10-CM | POA: Diagnosis not present

## 2015-09-25 NOTE — Progress Notes (Signed)
Subjective:  Patient ID: Krista Olson, female    DOB: [birth year]  Age: 59 y.o. MRN: [id number]  CC: The primary encounter diagnosis was Autoimmune hepatitis ([place]). Diagnoses of Hyperlipidemia LDL goal <160 and Hypothyroidism due to acquired atrophy of thyroid were also pertinent to this visit.  HPI Krista Olson presents for follow up on hyperlipidemia, hypothyroid and autoimmune hepatitis.   sHe feels generally well, is exercising several times per week and taking her medications as directed. Following a carbohydrate modified diet 6 days per week. Denies numbness, burning and tingling of extremities. Appetite is good.       Outpatient Prescriptions Prior to Visit  Medication Sig Dispense Refill  . atorvastatin (LIPITOR) 10 MG tablet TAKE 1 TABLET BY MOUTH ONCE DAILY. 30 tablet 5  . azaTHIOprine (IMURAN) 50 MG tablet Take 50 mg by mouth daily.    . Calcium Carbonate-Vitamin D (CALCIUM + D PO) Take 1 tablet by mouth daily.    . Estradiol-Estriol-Progesterone (BIEST/PROGESTERONE) CREA Place 1 application onto the skin daily. Apply  1/2 ml to skin 25 days per month, suspend for 5 days per month Compounded cream 60 g 11  . fish oil-omega-3 fatty acids 1000 MG capsule Take 1 capsule by mouth daily.    Marland Kitchen levothyroxine (SYNTHROID, LEVOTHROID) 75 MCG tablet TAKE 1 TABLET BY MOUTH ONCE DAILY ON AN EMPTY STOMACH. WAIT 30 MINUTES BEFORE TAKING OTHER MEDS. 30 tablet 6  . Multiple Vitamin (MULTIVITAMIN) tablet Take 1 tablet by mouth daily.    Marland Kitchen PROGESTERONE VA Progesterone 3% hrt crm. Apply 1/96mL vaginally one to two times daily for 25 days a month. This must be Called in for refills since compunded. This was called in on 02/15/13 with 11 refills.     No facility-administered medications prior to visit.    Review of Systems;  Patient denies headache, fevers, malaise, unintentional weight loss, skin rash, eye pain, sinus congestion and sinus pain, sore throat, dysphagia,  hemoptysis , cough, dyspnea,  wheezing, chest pain, palpitations, orthopnea, edema, abdominal pain, nausea, melena, diarrhea, constipation, flank pain, dysuria, hematuria, urinary  Frequency, nocturia, numbness, tingling, seizures,  Focal weakness, Loss of consciousness,  Tremor, insomnia, depression, anxiety, and suicidal ideation.      Objective:  BP 116/70 mmHg  Pulse 69  Temp(Src) 98.3 F (36.8 C) (Oral)  Resp 12  Ht 5\' 3"  (1.6 m)  Wt 133 lb 8 oz (60.555 kg)  BMI 23.65 kg/m2  SpO2 99%  BP Readings from Last 3 Encounters:  09/25/15 116/70  03/12/15 114/78  09/26/14 112/70    Wt Readings from Last 3 Encounters:  09/25/15 133 lb 8 oz (60.555 kg)  03/12/15 131 lb 8 oz (59.648 kg)  09/26/14 128 lb 8 oz (58.287 kg)    General appearance: alert, cooperative and appears stated age Ears: normal TM's and external ear canals both ears Throat: lips, mucosa, and tongue normal; teeth and gums normal Neck: no adenopathy, no carotid bruit, supple, symmetrical, trachea midline and thyroid not enlarged, symmetric, no tenderness/mass/nodules Back: symmetric, no curvature. ROM normal. No CVA tenderness. Lungs: clear to auscultation bilaterally Heart: regular rate and rhythm, S1, S2 normal, no murmur, click, rub or gallop Abdomen: soft, non-tender; bowel sounds normal; no masses,  no organomegaly Pulses: 2+ and symmetric Skin: Skin color, texture, turgor normal. No rashes or lesions Lymph nodes: Cervical, supraclavicular, and axillary nodes normal.  No results found for: HGBA1C  Lab Results  Component Value Date   CREATININE 0.65 09/17/2015  CREATININE 0.70 03/05/2015   CREATININE 0.7 09/06/2014    Lab Results  Component Value Date   WBC 7.0 01/30/2013   HGB 13.1 01/30/2013   HCT 38.6 01/30/2013   PLT 231.0 01/30/2013   GLUCOSE 94 09/17/2015   CHOL 207* 09/17/2015   TRIG 114.0 09/17/2015   HDL 59.30 09/17/2015   LDLDIRECT 122.8 06/14/2012   LDLCALC 125* 09/17/2015   ALT 19 09/17/2015   AST 22  09/17/2015   NA 141 09/17/2015   K 4.6 09/17/2015   CL 105 09/17/2015   CREATININE 0.65 09/17/2015   BUN 13 09/17/2015   CO2 27 09/17/2015   TSH 1.08 09/17/2015    Mm Screening Breast Tomo Bilateral  04/03/2015  CLINICAL DATA:  Screening. EXAM: DIGITAL SCREENING BILATERAL MAMMOGRAM WITH 3D TOMO WITH CAD COMPARISON:  Previous exam(s). ACR Breast Density Category b: There are scattered areas of fibroglandular density. FINDINGS: There are no findings suspicious for malignancy. Images were processed with CAD. IMPRESSION: No mammographic evidence of malignancy. A result letter of this screening mammogram will be mailed directly to the patient. RECOMMENDATION: Screening mammogram in one year. (Code:SM-B-01Y) BI-RADS CATEGORY  1: Negative. Electronically Signed   By: Skipper Cliche M.D.   On: 04/03/2015 15:20    Assessment & Plan:   Problem List Items Addressed This Visit    Autoimmune hepatitis ([place]) - Primary    Treated with Imuran x 3 + years.  Repeat liver enzymes are normal. Previous plans to dc after 3 years dicussed, since she is asymptomatic,  She risks nothing by staying on the medication .  Annual Follow up with Dr. Drue Novel annually advised.  Hepatitis A&B vaccines given.    Lab Results  Component Value Date   ALT 19 09/17/2015   AST 22 09/17/2015   ALKPHOS 58 09/17/2015   BILITOT 0.5 09/17/2015             Hyperlipidemia LDL goal <160    Historically Well controlled on current regimen. Liver function stable, no changes today.  Lab Results  Component Value Date   CHOL 207* 09/17/2015   HDL 59.30 09/17/2015   LDLCALC 125* 09/17/2015   LDLDIRECT 122.8 06/14/2012   TRIG 114.0 09/17/2015   CHOLHDL 3 09/17/2015               Hypothyroidism    Thyroid function is WNL on current dose.  No current changes needed.   Lab Results  Component Value Date   TSH 1.08 09/17/2015           A total of 25 minutes of face to face time was spent with patient more than  half of which was spent in counselling about the above mentioned conditions  and coordination of care   I am having Ms. Faulkner maintain her azaTHIOprine, Calcium Carbonate-Vitamin D (CALCIUM + D PO), multivitamin, fish oil-omega-3 fatty acids, PROGESTERONE VA, BIEST/PROGESTERONE, levothyroxine, and atorvastatin.  No orders of the defined types were placed in this encounter.    There are no discontinued medications.  Follow-up: Return in about 6 months (around 03/24/2016).   Crecencio Mc, MD

## 2015-09-25 NOTE — Progress Notes (Signed)
Pre-visit discussion using our clinic review tool. No additional management support is needed unless otherwise documented below in the visit note.  

## 2015-09-25 NOTE — Patient Instructions (Signed)
Your liver enzymes are normal.  You should follow up with Dr Drue Novel annually to monitor your liver for fibrosis

## 2015-09-28 DIAGNOSIS — E039 Hypothyroidism, unspecified: Secondary | ICD-10-CM | POA: Insufficient documentation

## 2015-09-28 NOTE — Assessment & Plan Note (Signed)
Historically Well controlled on current regimen. Liver function stable, no changes today.  Lab Results  Component Value Date   CHOL 207* 09/17/2015   HDL 59.30 09/17/2015   LDLCALC 125* 09/17/2015   LDLDIRECT 122.8 06/14/2012   TRIG 114.0 09/17/2015   CHOLHDL 3 09/17/2015

## 2015-09-28 NOTE — Assessment & Plan Note (Signed)
Treated with Imuran x 3 + years.  Repeat liver enzymes are normal. Previous plans to dc after 3 years dicussed, since she is asymptomatic,  She risks nothing by staying on the medication .  Annual Follow up with Dr. Drue Novel annually advised.  Hepatitis A&B vaccines given.    Lab Results  Component Value Date   ALT 19 09/17/2015   AST 22 09/17/2015   ALKPHOS 58 09/17/2015   BILITOT 0.5 09/17/2015

## 2015-09-28 NOTE — Assessment & Plan Note (Signed)
Thyroid function is WNL on current dose.  No current changes needed.   Lab Results  Component Value Date   TSH 1.08 09/17/2015

## 2015-10-03 NOTE — Telephone Encounter (Signed)
Mailed unread message to patient.  

## 2016-01-28 ENCOUNTER — Other Ambulatory Visit: Payer: Self-pay | Admitting: Internal Medicine

## 2016-03-03 ENCOUNTER — Other Ambulatory Visit: Payer: Self-pay | Admitting: Internal Medicine

## 2016-04-03 ENCOUNTER — Ambulatory Visit (INDEPENDENT_AMBULATORY_CARE_PROVIDER_SITE_OTHER): Payer: Managed Care, Other (non HMO) | Admitting: Internal Medicine

## 2016-04-03 ENCOUNTER — Encounter: Payer: Self-pay | Admitting: Internal Medicine

## 2016-04-03 VITALS — BP 120/80 | HR 64 | Ht 62.5 in | Wt 135.4 lb

## 2016-04-03 DIAGNOSIS — E559 Vitamin D deficiency, unspecified: Secondary | ICD-10-CM

## 2016-04-03 DIAGNOSIS — Z0000 Encounter for general adult medical examination without abnormal findings: Secondary | ICD-10-CM | POA: Diagnosis not present

## 2016-04-03 DIAGNOSIS — Z23 Encounter for immunization: Secondary | ICD-10-CM | POA: Diagnosis not present

## 2016-04-03 DIAGNOSIS — E038 Other specified hypothyroidism: Secondary | ICD-10-CM

## 2016-04-03 DIAGNOSIS — E785 Hyperlipidemia, unspecified: Secondary | ICD-10-CM

## 2016-04-03 DIAGNOSIS — K754 Autoimmune hepatitis: Secondary | ICD-10-CM

## 2016-04-03 DIAGNOSIS — E034 Atrophy of thyroid (acquired): Secondary | ICD-10-CM

## 2016-04-03 LAB — COMPREHENSIVE METABOLIC PANEL
ALK PHOS: 69 U/L (ref 39–117)
ALT: 20 U/L (ref 0–35)
AST: 22 U/L (ref 0–37)
Albumin: 4.9 g/dL (ref 3.5–5.2)
BILIRUBIN TOTAL: 0.6 mg/dL (ref 0.2–1.2)
BUN: 16 mg/dL (ref 6–23)
CALCIUM: 10.1 mg/dL (ref 8.4–10.5)
CO2: 28 mEq/L (ref 19–32)
CREATININE: 0.66 mg/dL (ref 0.40–1.20)
Chloride: 102 mEq/L (ref 96–112)
GFR: 96.95 mL/min (ref >60.00)
Glucose, Bld: 91 mg/dL (ref 70–99)
Potassium: 4.3 mEq/L (ref 3.5–5.1)
Sodium: 139 mEq/L (ref 135–145)
TOTAL PROTEIN: 7.8 g/dL (ref 6.0–8.3)

## 2016-04-03 LAB — LIPID PANEL
CHOLESTEROL: 207 mg/dL — AB (ref 0–200)
HDL: 52.9 mg/dL (ref >39.00)
LDL Cholesterol: 135 mg/dL — ABNORMAL HIGH (ref 0–99)
NonHDL: 154.58
TRIGLYCERIDES: 99 mg/dL (ref 0.0–149.0)
Total CHOL/HDL Ratio: 4
VLDL: 19.8 mg/dL (ref 0.0–40.0)

## 2016-04-03 LAB — VITAMIN D 25 HYDROXY (VIT D DEFICIENCY, FRACTURES): VITD: 34.96 ng/mL (ref 30.00–100.00)

## 2016-04-03 LAB — TSH: TSH: 1.05 u[IU]/mL (ref 0.35–4.50)

## 2016-04-03 MED ORDER — CIPROFLOXACIN HCL 500 MG PO TABS: 500.0000 mg | ORAL_TABLET | Freq: Two times a day (BID) | ORAL | Status: DC

## 2016-04-03 MED ORDER — PROMETHAZINE HCL 25 MG PO TABS: 25.0000 mg | ORAL_TABLET | Freq: Three times a day (TID) | ORAL | Status: DC | PRN

## 2016-04-03 MED ORDER — PROMETHAZINE HCL 12.5 MG RE SUPP: 12.5000 mg | Freq: Four times a day (QID) | RECTAL | Status: DC | PRN

## 2016-04-03 NOTE — Progress Notes (Signed)
Patient ID: Krista Olson, female    DOB: 1956-05-01  Age: 60 y.o. MRN: [id number]  The patient is here for annual physical  examination and management of other chronic and acute problems.    PAP SMEAR 2014,  wants to defer  COLONOSCOPY 2008 NORMAL 10 YR FOLLOW UP 2018  HEP C HIV SCREENED NEG 2016 MAMMOGRAM NORMAL 2016   nonfasting labs in January 2017 at [facility]   he risk factors are reflected in the social history.  The roster of all physicians providing medical care to patient - is listed in the Snapshot section of the chart.  Home safety : The patient has smoke detectors in the home. They wear seatbelts.  There are no firearms at home. There is no violence in the home.   There is no risks for hepatitis, STDs or HIV. There is no   history of blood transfusion. They have no travel history to infectious disease endemic areas of the world.  The patient has seen their dentist in the last six month. They have seen their eye doctor in the last year. T  They do not  have excessive sun exposure. Discussed the need for sun protection: hats, long sleeves and use of sunscreen if there is significant sun exposure.   Diet: the importance of a healthy diet is discussed. They do have a healthy diet.  The benefits of regular aerobic exercise were discussed. She bikes 4 times per week ,  30 minutes.   Depression screen: there are no signs or vegative symptoms of depression- irritability, change in appetite, anhedonia, sadness/tearfullness.  The following portions of the patient's history were reviewed and updated as appropriate: allergies, current medications, past family history, past medical history,  past surgical history, past social history  and problem list.  Visual acuity was not assessed per patient preference since she has regular follow up with her ophthalmologist. Hearing and body mass index were assessed and reviewed.   During the course of the visit the patient was educated and counseled  about appropriate screening and preventive services including : fall prevention , diabetes screening, nutrition counseling, colorectal cancer screening, and recommended immunizations.    CC: The primary encounter diagnosis was Autoimmune hepatitis ([place]). Diagnoses of Hyperlipidemia, Vitamin D deficiency, Need for varicella vaccine, Visit for preventive health examination, and Hypothyroidism due to acquired atrophy of thyroid were also pertinent to this visit.  History Denetta has a past medical history of Hepatitis, autoimmune ([place]) and Thyroid disease.   She has past surgical history that includes Percutaneous liver biopsy.   Her family history includes Cancer (age of onset: 33) in her maternal aunt; Cancer (age of onset: 88) in her brother; Goiter in her mother; Heart disease in her brother; Heart disease (age of onset: 68) in her mother; Kidney disease in her father.She reports that she has never smoked. She has never used smokeless tobacco. She reports that she drinks about 3.0 oz of alcohol per week. She reports that she does not use illicit drugs.  Outpatient Prescriptions Prior to Visit  Medication Sig Dispense Refill  . atorvastatin (LIPITOR) 10 MG tablet TAKE 1 TABLET BY MOUTH ONCE DAILY. 30 tablet 5  . azaTHIOprine (IMURAN) 50 MG tablet Take 50 mg by mouth daily.    . Calcium Carbonate-Vitamin D (CALCIUM + D PO) Take 1 tablet by mouth daily.    . fish oil-omega-3 fatty acids 1000 MG capsule Take 1 capsule by mouth daily.    Marland Kitchen levothyroxine (SYNTHROID, LEVOTHROID) 75  MCG tablet TAKE 1 TABLET BY MOUTH ONCE DAILY ON AN EMPTY STOMACH. WAIT 30 MINUTES BEFORE TAKING OTHER MEDS. 30 tablet 5  . Multiple Vitamin (MULTIVITAMIN) tablet Take 1 tablet by mouth daily.    . Estradiol-Estriol-Progesterone (BIEST/PROGESTERONE) CREA Place 1 application onto the skin daily. Apply  1/2 ml to skin 25 days per month, suspend for 5 days per month Compounded cream 60 g 11  . PROGESTERONE VA Progesterone 3% hrt  crm. Apply 1/23mL vaginally one to two times daily for 25 days a month. This must be Called in for refills since compunded. This was called in on 02/15/13 with 11 refills.     No facility-administered medications prior to visit.    Review of Systems :  Patient denies headache, fevers, malaise, unintentional weight loss, skin rash, eye pain, sinus congestion and sinus pain, sore throat, dysphagia,  hemoptysis , cough, dyspnea, wheezing, chest pain, palpitations, orthopnea, edema, abdominal pain, nausea, melena, diarrhea, constipation, flank pain, dysuria, hematuria, urinary  Frequency, nocturia, numbness, tingling, seizures,  Focal weakness, Loss of consciousness,  Tremor, insomnia, depression, anxiety, and suicidal ideation.      Objective:  BP 120/80 mmHg  Pulse 64  Ht 5' 2.5" (1.588 m)  Wt 135 lb 6.4 oz (61.417 kg)  BMI 24.35 kg/m2  Physical Exam   General appearance: alert, cooperative and appears stated age Head: Normocephalic, without obvious abnormality, atraumatic Eyes: conjunctivae/corneas clear. PERRL, EOM's intact. Fundi benign. Ears: normal TM's and external ear canals both ears Nose: Nares normal. Septum midline. Mucosa normal. No drainage or sinus tenderness. Throat: lips, mucosa, and tongue normal; teeth and gums normal Neck: no adenopathy, no carotid bruit, no JVD, supple, symmetrical, trachea midline and thyroid not enlarged, symmetric, no tenderness/mass/nodules Lungs: clear to auscultation bilaterally Breasts: normal appearance, no masses or tenderness Heart: regular rate and rhythm, S1, S2 normal, no murmur, click, rub or gallop Abdomen: soft, non-tender; bowel sounds normal; no masses,  no organomegaly Extremities: extremities normal, atraumatic, no cyanosis or edema Pulses: 2+ and symmetric Skin: Skin color, texture, turgor normal. No rashes or lesions Neurologic: Alert and oriented X 3, normal strength and tone. Normal symmetric reflexes. Normal coordination and  gait.     Assessment & Plan:   Problem List Items Addressed This Visit    Autoimmune hepatitis ([place]) - Primary    Treated with Imuran x 3 + years.  Repeat liver enzymes are normal. .  Annual Follow up with Dr. Drue Novel has been done , and   Hepatitis A&B vaccines given.    Lab Results  Component Value Date   ALT 20 04/03/2016   AST 22 04/03/2016   ALKPHOS 69 04/03/2016   BILITOT 0.6 04/03/2016               Relevant Orders   Comprehensive metabolic panel (Completed)   Visit for preventive health examination    Annual comprehensive preventive exam was done as well as an evaluation and management of chronic conditions .  During the course of the visit the patient was educated and counseled about appropriate screening and preventive services including :  diabetes screening, lipid analysis with projected  10 year  risk for CAD, WHICH IS 6% , , nutrition counseling, breast, cervical and colorectal cancer screening, and recommended immunizations.  Printed recommendations for health maintenance screenings was give      Hypothyroidism    Thyroid function is WNL on current dose.  No current changes needed.   Lab Results  Component Value Date  TSH 1.05 04/03/2016          Other Visit Diagnoses    Hyperlipidemia        Relevant Orders    Lipid panel (Completed)    TSH (Completed)    Vitamin D deficiency        Relevant Orders    VITAMIN D 25 Hydroxy (Vit-D Deficiency, Fractures) (Completed)    Need for varicella vaccine        Relevant Orders    Varicella Zoster Abs, IgG/IgM (Completed)       I have discontinued Ms. Cooprider's PROGESTERONE VA and BIEST/PROGESTERONE. I am also having her start on ciprofloxacin and promethazine. Additionally, I am having her maintain her azaTHIOprine, Calcium Carbonate-Vitamin D (CALCIUM + D PO), multivitamin, fish oil-omega-3 fatty acids, atorvastatin, levothyroxine, and promethazine.  Meds ordered this encounter  Medications  .  ciprofloxacin (CIPRO) 500 MG tablet    Sig: Take 1 tablet (500 mg total) by mouth 2 (two) times daily.    Dispense:  20 tablet    Refill:  0    For diarrhea  . promethazine (PHENERGAN) 12.5 MG suppository    Sig: Place 1 suppository (12.5 mg total) rectally every 6 (six) hours as needed for nausea or vomiting.    Dispense:  12 each    Refill:  0  . DISCONTD: promethazine (PHENERGAN) 25 MG tablet    Sig: Take 1 tablet (25 mg total) by mouth every 8 (eight) hours as needed for nausea or vomiting.    Dispense:  20 tablet    Refill:  0  . promethazine (PHENERGAN) 25 MG tablet    Sig: Take 1 tablet (25 mg total) by mouth every 8 (eight) hours as needed for nausea or vomiting.    Dispense:  20 tablet    Refill:  0    Medications Discontinued During This Encounter  Medication Reason  . PROGESTERONE VA   . Estradiol-Estriol-Progesterone (BIEST/PROGESTERONE) CREA   . promethazine (PHENERGAN) 25 MG tablet Reorder    Follow-up: No Follow-up on file.   Crecencio Mc, MD

## 2016-04-03 NOTE — Patient Instructions (Signed)

## 2016-04-05 ENCOUNTER — Encounter: Payer: Self-pay | Admitting: Internal Medicine

## 2016-04-05 NOTE — Assessment & Plan Note (Signed)
Annual comprehensive preventive exam was done as well as an evaluation and management of chronic conditions .  During the course of the visit the patient was educated and counseled about appropriate screening and preventive services including :  diabetes screening, lipid analysis with projected  10 year  risk for CAD, WHICH IS 6% , , nutrition counseling, breast, cervical and colorectal cancer screening, and recommended immunizations.  Printed recommendations for health maintenance screenings was give

## 2016-04-05 NOTE — Assessment & Plan Note (Signed)
Treated with Imuran x 3 + years.  Repeat liver enzymes are normal. .  Annual Follow up with Dr. Drue Novel has been done , and   Hepatitis A&B vaccines given.    Lab Results  Component Value Date   ALT 20 04/03/2016   AST 22 04/03/2016   ALKPHOS 69 04/03/2016   BILITOT 0.6 04/03/2016

## 2016-04-05 NOTE — Assessment & Plan Note (Signed)
Thyroid function is WNL on current dose.  No current changes needed.   Lab Results  Component Value Date   TSH 1.05 04/03/2016

## 2016-04-06 LAB — VARICELLA ZOSTER ABS, IGG/IGM
VARICELLA: 755 {index} (ref >165)
Varicella IgM: <0.91 index (ref 0.00–0.90)

## 2016-04-23 ENCOUNTER — Other Ambulatory Visit: Payer: Self-pay | Admitting: Internal Medicine

## 2016-04-23 DIAGNOSIS — Z1231 Encounter for screening mammogram for malignant neoplasm of breast: Secondary | ICD-10-CM

## 2016-05-08 ENCOUNTER — Ambulatory Visit
Admission: RE | Admit: 2016-05-08 | Discharge: 2016-05-08 | Disposition: A | Discharge status: Home / Self Care | Payer: Managed Care, Other (non HMO) | Source: Ambulatory Visit | Attending: Internal Medicine | Admitting: Internal Medicine

## 2016-05-08 DIAGNOSIS — R928 Other abnormal and inconclusive findings on diagnostic imaging of breast: Secondary | ICD-10-CM | POA: Diagnosis not present

## 2016-05-08 DIAGNOSIS — Z1231 Encounter for screening mammogram for malignant neoplasm of breast: Secondary | ICD-10-CM | POA: Insufficient documentation

## 2016-05-10 ENCOUNTER — Other Ambulatory Visit: Payer: Self-pay | Admitting: Internal Medicine

## 2016-05-10 DIAGNOSIS — R928 Other abnormal and inconclusive findings on diagnostic imaging of breast: Secondary | ICD-10-CM

## 2016-05-12 ENCOUNTER — Ambulatory Visit
Admission: RE | Admit: 2016-05-12 | Discharge: 2016-05-12 | Disposition: A | Discharge status: Home / Self Care | Payer: Managed Care, Other (non HMO) | Source: Ambulatory Visit | Attending: Internal Medicine | Admitting: Internal Medicine

## 2016-05-12 DIAGNOSIS — R928 Other abnormal and inconclusive findings on diagnostic imaging of breast: Secondary | ICD-10-CM

## 2016-05-19 ENCOUNTER — Other Ambulatory Visit: Payer: Managed Care, Other (non HMO)

## 2016-05-19 ENCOUNTER — Ambulatory Visit: Payer: Managed Care, Other (non HMO)

## 2016-06-29 ENCOUNTER — Encounter: Payer: Self-pay | Admitting: *Deleted

## 2016-06-29 ENCOUNTER — Telehealth: Payer: Self-pay | Admitting: *Deleted

## 2016-06-29 NOTE — Telephone Encounter (Signed)
Patient will need recent labs faxed over to [facility]  Fax [place]

## 2016-06-29 NOTE — Telephone Encounter (Signed)
Responded via MyChart.

## 2016-08-02 ENCOUNTER — Other Ambulatory Visit: Payer: Self-pay | Admitting: Internal Medicine

## 2016-08-09 IMAGING — MG MM DIGITAL SCREENING BILAT W/ TOMO W/ CAD
8 of 12 series · 8 of 28 positions shown · non-contrast
Comparison: Previous exam(s).

CLINICAL DATA: Screening.

EXAM:
2D DIGITAL SCREENING BILATERAL MAMMOGRAM WITH CAD AND ADJUNCT TOMO

[L CC]
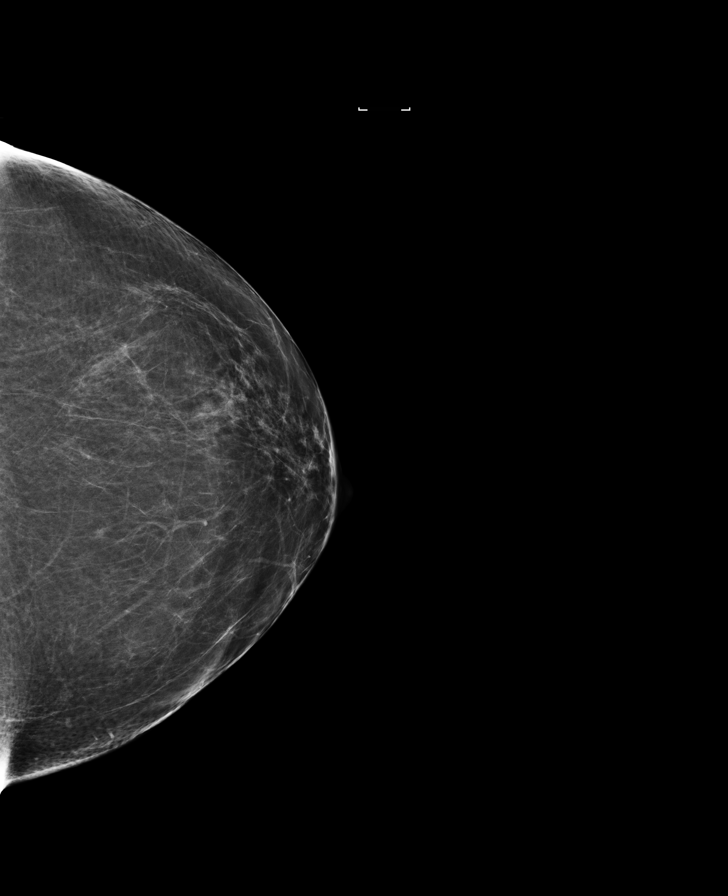

[L MLO]
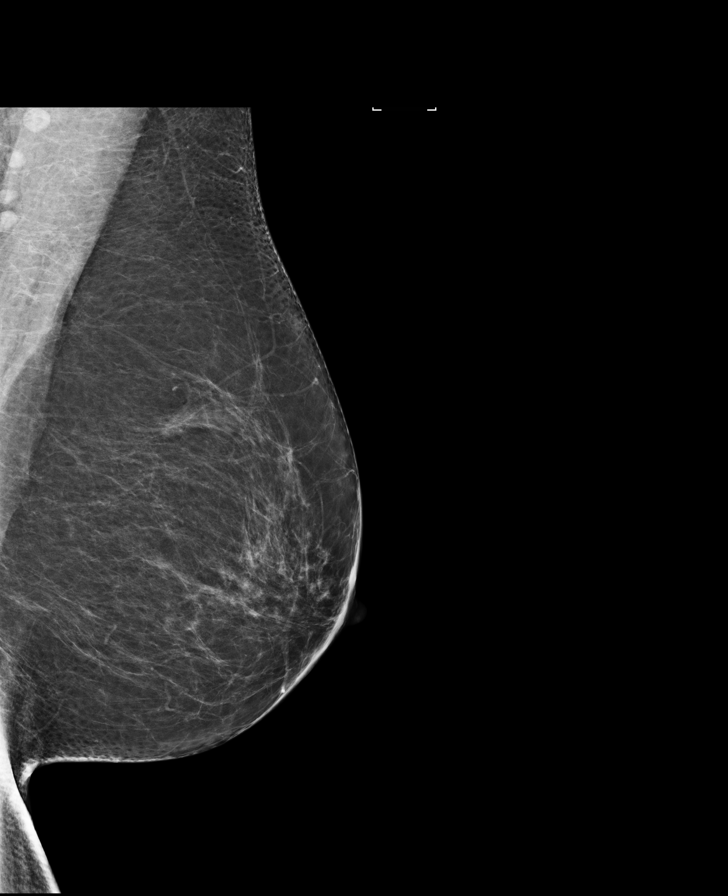

[R MLO]
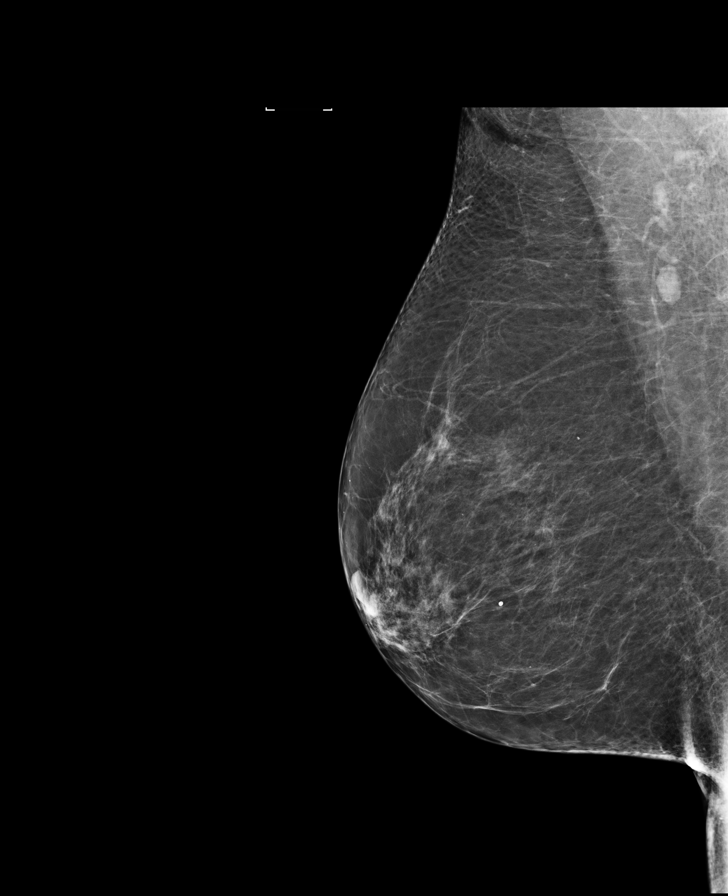

[R MLO synth-2D]
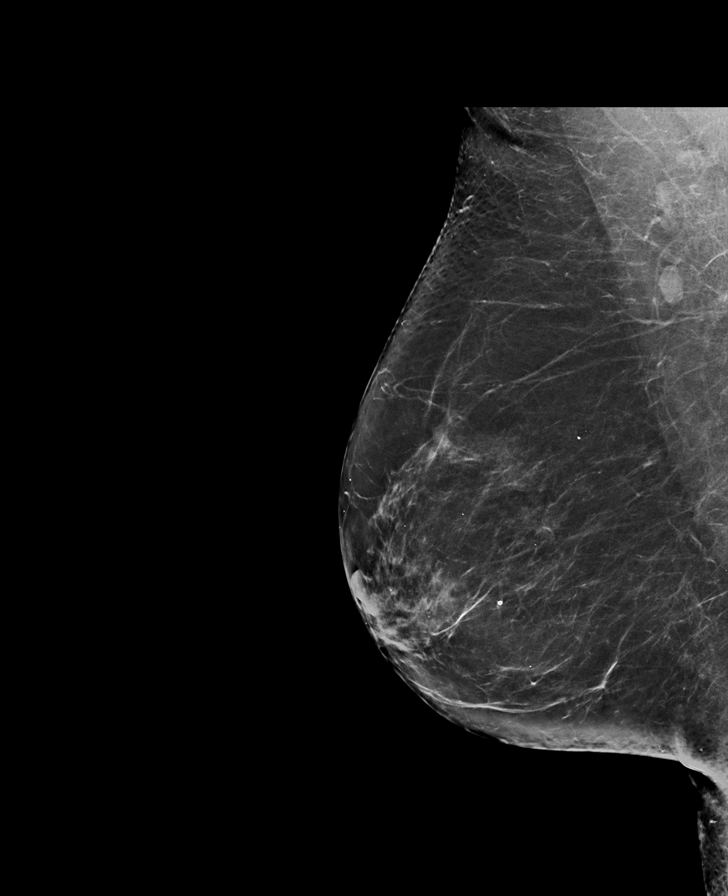

[R CC]
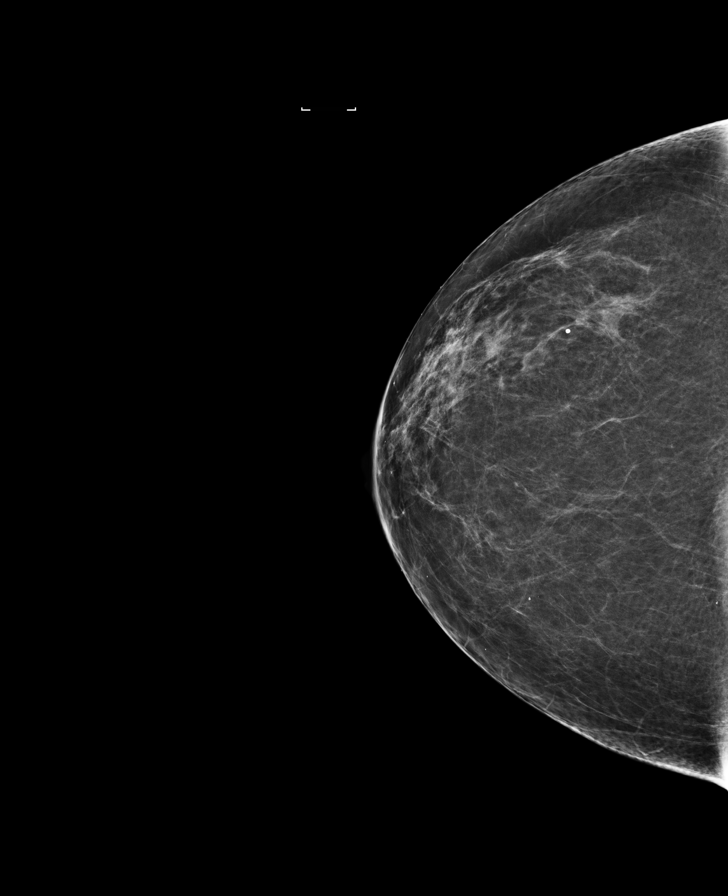

[L CC synth-2D]
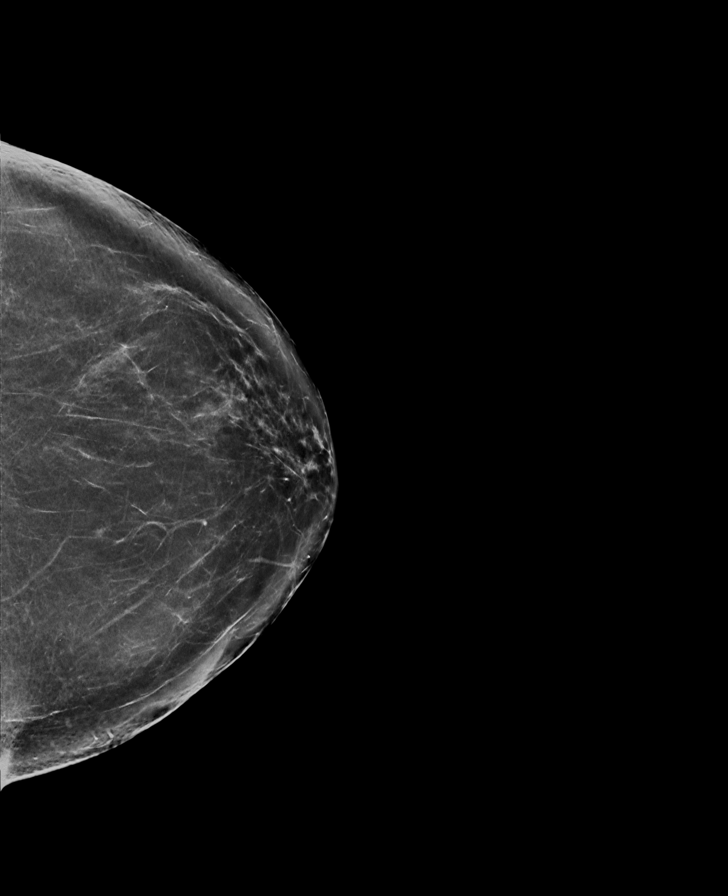

[R CC synth-2D]
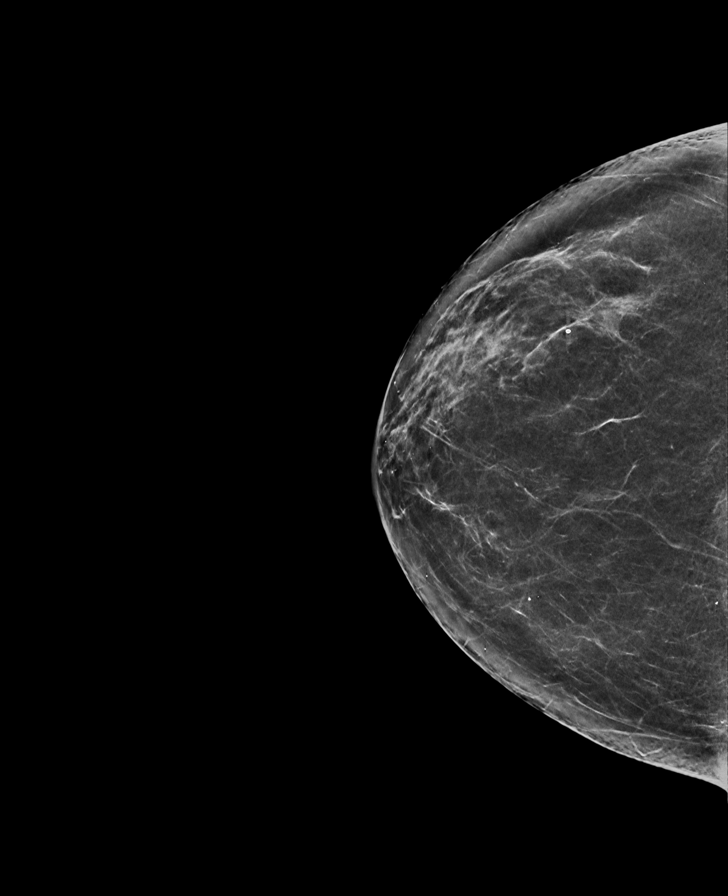

[L MLO synth-2D]
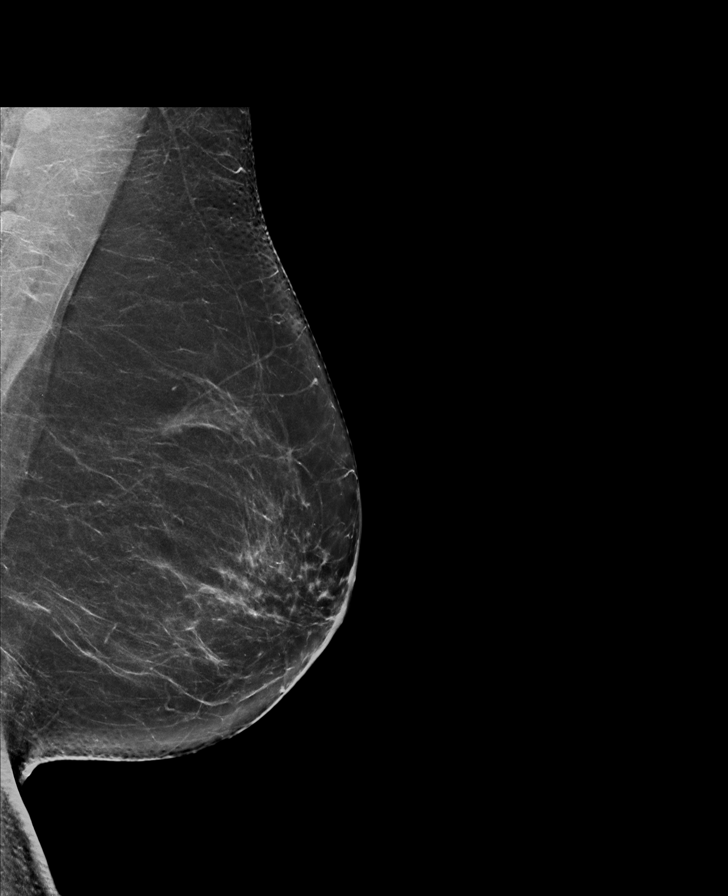

[8 of 28 positions shown; findings below may reference images not displayed]

ACR Breast Density Category b: There are scattered areas of
fibroglandular density.
FINDINGS: In the right breast, a possible mass warrants further evaluation. In
the left breast, no findings suspicious for malignancy. Images were
processed with CAD.
IMPRESSION: Further evaluation is suggested for possible mass in the right
breast.

RECOMMENDATION:
Diagnostic mammogram and possibly ultrasound of the right breast.
(Code:[id number])

The patient will be contacted regarding the findings, and additional
imaging will be scheduled.

BI-RADS CATEGORY  0: Incomplete. Need additional imaging evaluation
and/or prior mammograms for comparison.

## 2016-09-04 ENCOUNTER — Other Ambulatory Visit: Payer: Self-pay | Admitting: Internal Medicine

## 2017-02-02 ENCOUNTER — Other Ambulatory Visit: Payer: Self-pay | Admitting: Internal Medicine

## 2017-04-01 ENCOUNTER — Other Ambulatory Visit: Payer: Self-pay | Admitting: Internal Medicine

## 2017-04-06 ENCOUNTER — Other Ambulatory Visit (HOSPITAL_COMMUNITY)
Admission: RE | Admit: 2017-04-06 | Discharge: 2017-04-06 | Disposition: A | Discharge status: Home / Self Care | Payer: Managed Care, Other (non HMO) | Source: Ambulatory Visit | Attending: Internal Medicine | Admitting: Internal Medicine

## 2017-04-06 ENCOUNTER — Ambulatory Visit (INDEPENDENT_AMBULATORY_CARE_PROVIDER_SITE_OTHER): Payer: Managed Care, Other (non HMO) | Admitting: Internal Medicine

## 2017-04-06 ENCOUNTER — Encounter: Payer: Self-pay | Admitting: Internal Medicine

## 2017-04-06 VITALS — BP 130/84 | HR 84 | Temp 98.6°F | Resp 16 | Ht 62.5 in | Wt 130.0 lb

## 2017-04-06 DIAGNOSIS — Z1239 Encounter for other screening for malignant neoplasm of breast: Secondary | ICD-10-CM

## 2017-04-06 DIAGNOSIS — Z79899 Other long term (current) drug therapy: Secondary | ICD-10-CM

## 2017-04-06 DIAGNOSIS — Z124 Encounter for screening for malignant neoplasm of cervix: Secondary | ICD-10-CM | POA: Diagnosis not present

## 2017-04-06 DIAGNOSIS — E785 Hyperlipidemia, unspecified: Secondary | ICD-10-CM

## 2017-04-06 DIAGNOSIS — E034 Atrophy of thyroid (acquired): Secondary | ICD-10-CM | POA: Diagnosis not present

## 2017-04-06 DIAGNOSIS — Z1231 Encounter for screening mammogram for malignant neoplasm of breast: Secondary | ICD-10-CM | POA: Diagnosis not present

## 2017-04-06 DIAGNOSIS — Z23 Encounter for immunization: Secondary | ICD-10-CM

## 2017-04-06 DIAGNOSIS — K754 Autoimmune hepatitis: Secondary | ICD-10-CM

## 2017-04-06 DIAGNOSIS — Z0000 Encounter for general adult medical examination without abnormal findings: Secondary | ICD-10-CM | POA: Diagnosis not present

## 2017-04-06 NOTE — Progress Notes (Signed)
Patient ID: Krista Olson, female    DOB: 1956/10/12  Age: 61 y.o. MRN: [id number]  The patient is here for annualwellness examination and management of other chronic and acute problems.    LAST COLONOSCOPY 2008  Annual fobt negative last one 2016 Has not been back to the liver clinic since August 2017,  Last labs Aug 2017      The risk factors are reflected in the social history.  The roster of all physicians providing medical care to patient - is listed in the Snapshot section of the chart.  Activities of daily living:  The patient is 100% independent in all ADLs: dressing, toileting, feeding as well as independent mobility  Home safety : The patient has smoke detectors in the home. They wear seatbelts.  There are no firearms at home. There is no violence in the home.   There is no risks for hepatitis, STDs or HIV. There is no   history of blood transfusion. They have no travel history to infectious disease endemic areas of the world.  The patient has seen their dentist in the last six month. They have seen their eye doctor in the last year. They admit to slight hearing difficulty with regard to whispered voices and some television programs.  They have deferred audiologic testing in the last year.  They do not  have excessive sun exposure. Discussed the need for sun protection: hats, long sleeves and use of sunscreen if there is significant sun exposure.   Diet: the importance of a healthy diet is discussed. They do have a healthy diet.  The benefits of regular aerobic exercise were discussed. She walks 4 times per week ,  20 minutes.   Depression screen: there are no signs or vegative symptoms of depression- irritability, change in appetite, anhedonia, sadness/tearfullness.  Cognitive assessment: the patient manages all their financial and personal affairs and is actively engaged. They could relate day,date,year and events; recalled 2/3 objects at 3 minutes; performed clock-face test  normally.  The following portions of the patient's history were reviewed and updated as appropriate: allergies, current medications, past family history, past medical history,  past surgical history, past social history  and problem list.  Visual acuity was not assessed per patient preference since she has regular follow up with her ophthalmologist. Hearing and body mass index were assessed and reviewed.   During the course of the visit the patient was educated and counseled about appropriate screening and preventive services including : fall prevention , diabetes screening, nutrition counseling, colorectal cancer screening, and recommended immunizations.    CC: The primary encounter diagnosis was Hypothyroidism due to acquired atrophy of thyroid. Diagnoses of Hyperlipidemia LDL goal <160, Long-term use of high-risk medication, Breast cancer screening, Cervical cancer screening, Need for vaccination with 13-polyvalent pneumococcal conjugate vaccine, Autoimmune hepatitis ([place]), and Visit for preventive health examination were also pertinent to this visit.  History Krista Olson has a past medical history of Hepatitis, autoimmune ([place]) and Thyroid disease.   She has a past surgical history that includes Percutaneous liver biopsy.   Her family history includes Cancer (age of onset: 54) in her maternal aunt; Cancer (age of onset: 54) in her brother; Goiter in her mother; Heart disease in her brother; Heart disease (age of onset: 64) in her mother; Kidney disease in her father.She reports that she has never smoked. She has never used smokeless tobacco. She reports that she drinks about 3.0 oz of alcohol per week . She reports that she does  not use drugs.  Outpatient Medications Prior to Visit  Medication Sig Dispense Refill  . atorvastatin (LIPITOR) 10 MG tablet TAKE 1 TABLET BY MOUTH ONCE DAILY *NEEDSAPPOINTMENT FOR FURTHER REFILLS* 30 tablet 0  . azaTHIOprine (IMURAN) 50 MG tablet Take 50 mg by mouth daily.     . Calcium Carbonate-Vitamin D (CALCIUM + D PO) Take 1 tablet by mouth daily.    . ciprofloxacin (CIPRO) 500 MG tablet Take 1 tablet (500 mg total) by mouth 2 (two) times daily. 20 tablet 0  . fish oil-omega-3 fatty acids 1000 MG capsule Take 1 capsule by mouth daily.    . Multiple Vitamin (MULTIVITAMIN) tablet Take 1 tablet by mouth daily.    Marland Kitchen levothyroxine (SYNTHROID, LEVOTHROID) 75 MCG tablet TAKE 1 TABLET BY MOUTH ONCE DAILY ON AN EMPTY STOMACH. WAIT 30 MINUTES BEFORE TAKING OTHER MEDS. 30 tablet 7  . promethazine (PHENERGAN) 12.5 MG suppository Place 1 suppository (12.5 mg total) rectally every 6 (six) hours as needed for nausea or vomiting. (Patient not taking: Reported on 04/06/2017) 12 each 0  . promethazine (PHENERGAN) 25 MG tablet Take 1 tablet (25 mg total) by mouth every 8 (eight) hours as needed for nausea or vomiting. (Patient not taking: Reported on 04/06/2017) 20 tablet 0   No facility-administered medications prior to visit.     Review of Systems   Patient denies headache, fevers, malaise, unintentional weight loss, skin rash, eye pain, sinus congestion and sinus pain, sore throat, dysphagia,  hemoptysis , cough, dyspnea, wheezing, chest pain, palpitations, orthopnea, edema, abdominal pain, nausea, melena, diarrhea, constipation, flank pain, dysuria, hematuria, urinary  Frequency, nocturia, numbness, tingling, seizures,  Focal weakness, Loss of consciousness,  Tremor, insomnia, depression, anxiety, and suicidal ideation.      Objective:  BP 130/84 (BP Location: Left Arm, Patient Position: Sitting, Cuff Size: Normal)   Pulse 84   Temp 98.6 F (37 C) (Oral)   Resp 16   Ht 5' 2.5" (1.588 m)   Wt 130 lb (59 kg)   SpO2 95%   BMI 23.40 kg/m   Physical Exam   . General Appearance:    Alert, cooperative, no distress, appears stated age  Head:    Normocephalic, without obvious abnormality, atraumatic  Eyes:    PERRL, conjunctiva/corneas clear, EOM's intact, fundi     benign, both eyes  Ears:    Normal TM's and external ear canals, both ears  Nose:   Nares normal, septum midline, mucosa normal, no drainage    or sinus tenderness  Throat:   Lips, mucosa, and tongue normal; teeth and gums normal  Neck:   Supple, symmetrical, trachea midline, no adenopathy;    thyroid:  no enlargement/tenderness/nodules; no carotid   bruit or JVD  Back:     Symmetric, no curvature, ROM normal, no CVA tenderness  Lungs:     Clear to auscultation bilaterally, respirations unlabored  Chest Wall:    No tenderness or deformity   Heart:    Regular rate and rhythm, S1 and S2 normal, no murmur, rub   or gallop  Breast Exam:    No tenderness, masses, or nipple abnormality  Abdomen:     Soft, non-tender, bowel sounds active all four quadrants,    no masses, no organomegaly  Genitalia:    Pelvic: cervix normal in appearance, external genitalia normal, no adnexal masses or tenderness, no cervical motion tenderness, rectovaginal septum normal, uterus normal size, shape, and consistency and vagina normal without discharge  Extremities:   Extremities  normal, atraumatic, no cyanosis or edema  Pulses:   2+ and symmetric all extremities  Skin:   Skin color, texture, turgor normal, no rashes or lesions  Lymph nodes:   Cervical, supraclavicular, and axillary nodes normal  Neurologic:   CNII-XII intact, normal strength, sensation and reflexes    throughout      Assessment & Plan:   Problem List Items Addressed This Visit    Visit for preventive health examination    Annual comprehensive preventive exam was done as well as an evaluation and management of chronic conditions .  During the course of the visit the patient was educated and counseled about appropriate screening and preventive services including :  diabetes screening, lipid analysis with projected  10 year  risk for CAD , nutrition counseling, breast, cervical and colorectal cancer screening, and recommended immunizations.   Printed recommendations for health maintenance screenings was given      Hypothyroidism - Primary    Thyroid function is overactive on current dose.  I have sent a lower dose of levothyroxine to her pharmacy and would like patient to change immediately, repeat TSH in 6 week s.  Lab Results  Component Value Date   TSH 0.23 (L) 04/06/2017         Relevant Medications   levothyroxine (SYNTHROID, LEVOTHROID) 50 MCG tablet   Other Relevant Orders   TSH (Completed)   Hyperlipidemia LDL goal <160    Historically Well controlled on current regimen. Liver enzymes rer normal, no changes today.  Lab Results  Component Value Date   CHOL 172 04/06/2017   HDL 51.50 04/06/2017   LDLCALC 89 04/06/2017   LDLDIRECT 96.0 04/06/2017   TRIG 155.0 (H) 04/06/2017   CHOLHDL 3 04/06/2017   Lab Results  Component Value Date   ALT 26 04/06/2017   AST 26 04/06/2017   ALKPHOS 56 04/06/2017   BILITOT 0.3 04/06/2017               Relevant Orders   Lipid panel (Completed)   LDL cholesterol, direct (Completed)   Autoimmune hepatitis ([place])    Treated with Imuran x 3 + years.  Repeat liver enzymes are normal. .  Annual Follow up with Dr. Drue Novel has not been done, per patient preference.    She prefera to follow up with me and continue Imuran. Hepatitis A&B vaccines up to date .    Lab Results  Component Value Date   ALT 26 04/06/2017   AST 26 04/06/2017   ALKPHOS 56 04/06/2017   BILITOT 0.3 04/06/2017                Other Visit Diagnoses    Long-term use of high-risk medication       Relevant Orders   Comprehensive metabolic panel (Completed)   CBC with Differential/Platelet (Completed)   Breast cancer screening       Relevant Orders   MM SCREENING BREAST TOMO BILATERAL   Cervical cancer screening       Relevant Orders   Cytology - PAP (Completed)   Need for vaccination with 13-polyvalent pneumococcal conjugate vaccine       Relevant Orders   Pneumococcal conjugate  vaccine 13-valent (Completed)      I have discontinued Ms. Wilsey's promethazine and promethazine. I have also changed her levothyroxine. Additionally, I am having her maintain her azaTHIOprine, Calcium Carbonate-Vitamin D (CALCIUM + D PO), multivitamin, fish oil-omega-3 fatty acids, ciprofloxacin, and atorvastatin.  Meds ordered this encounter  Medications  .  levothyroxine (SYNTHROID, LEVOTHROID) 50 MCG tablet    Sig: Take 1 tablet (50 mcg total) by mouth daily before breakfast.    Dispense:  90 tablet    Refill:  0    Medications Discontinued During This Encounter  Medication Reason  . promethazine (PHENERGAN) 12.5 MG suppository Patient has not taken in last 30 days  . promethazine (PHENERGAN) 25 MG tablet Patient has not taken in last 30 days  . levothyroxine (SYNTHROID, LEVOTHROID) 75 MCG tablet Reorder    Follow-up: No Follow-up on file.   Crecencio Mc, MD

## 2017-04-06 NOTE — Patient Instructions (Signed)
Good to see you!  I am happy to refill your Imuran for you and you can return here for quarterly surveillance labs (no office visit needed ,  jsut a lab appointment)   I will initiate the order for your colon cancer screening test called  Cologuard if you will confirm that your insurance will pay for it. .  It will be delivered to your house, and you will send off a stool sample in the envelope it provides.    The ShingRx vaccine will be available in about 6 months and IS ADVISED for all interested adults over 50 to prevent shingles   Mammogram ordered   Health Maintenance for Postmenopausal Women Menopause is a normal process in which your reproductive ability comes to an end. This process happens gradually over a span of months to years, usually between the ages of 78 and 41. Menopause is complete when you have missed 12 consecutive menstrual periods. It is important to talk with your health care provider about some of the most common conditions that affect postmenopausal women, such as heart disease, cancer, and bone loss (osteoporosis). Adopting a healthy lifestyle and getting preventive care can help to promote your health and wellness. Those actions can also lower your chances of developing some of these common conditions. What should I know about menopause? During menopause, you may experience a number of symptoms, such as:  Moderate-to-severe hot flashes.  Night sweats.  Decrease in sex drive.  Mood swings.  Headaches.  Tiredness.  Irritability.  Memory problems.  Insomnia. Choosing to treat or not to treat menopausal changes is an individual decision that you make with your health care provider. What should I know about hormone replacement therapy and supplements? Hormone therapy products are effective for treating symptoms that are associated with menopause, such as hot flashes and night sweats. Hormone replacement carries certain risks, especially as you become older.  If you are thinking about using estrogen or estrogen with progestin treatments, discuss the benefits and risks with your health care provider. What should I know about heart disease and stroke? Heart disease, heart attack, and stroke become more likely as you age. This may be due, in part, to the hormonal changes that your body experiences during menopause. These can affect how your body processes dietary fats, triglycerides, and cholesterol. Heart attack and stroke are both medical emergencies. There are many things that you can do to help prevent heart disease and stroke:  Have your blood pressure checked at least every 1-2 years. High blood pressure causes heart disease and increases the risk of stroke.  If you are 7-59 years old, ask your health care provider if you should take aspirin to prevent a heart attack or a stroke.  Do not use any tobacco products, including cigarettes, chewing tobacco, or electronic cigarettes. If you need help quitting, ask your health care provider.  It is important to eat a healthy diet and maintain a healthy weight.  Be sure to include plenty of vegetables, fruits, low-fat dairy products, and lean protein.  Avoid eating foods that are high in solid fats, added sugars, or salt (sodium).  Get regular exercise. This is one of the most important things that you can do for your health.  Try to exercise for at least 150 minutes each week. The type of exercise that you do should increase your heart rate and make you sweat. This is known as moderate-intensity exercise.  Try to do strengthening exercises at least twice each week.  Do these in addition to the moderate-intensity exercise.  Know your numbers.Ask your health care provider to check your cholesterol and your blood glucose. Continue to have your blood tested as directed by your health care provider. What should I know about cancer screening? There are several types of cancer. Take the following steps to  reduce your risk and to catch any cancer development as early as possible. Breast Cancer  Practice breast self-awareness.  This means understanding how your breasts normally appear and feel.  It also means doing regular breast self-exams. Let your health care provider know about any changes, no matter how small.  If you are 49 or older, have a clinician do a breast exam (clinical breast exam or CBE) every year. Depending on your age, family history, and medical history, it may be recommended that you also have a yearly breast X-ray (mammogram).  If you have a family history of breast cancer, talk with your health care provider about genetic screening.  If you are at high risk for breast cancer, talk with your health care provider about having an MRI and a mammogram every year.  Breast cancer (BRCA) gene test is recommended for women who have family members with BRCA-related cancers. Results of the assessment will determine the need for genetic counseling and BRCA1 and for BRCA2 testing. BRCA-related cancers include these types:  Breast. This occurs in males or females.  Ovarian.  Tubal. This may also be called fallopian tube cancer.  Cancer of the abdominal or pelvic lining (peritoneal cancer).  Prostate.  Pancreatic. Cervical, Uterine, and Ovarian Cancer  Your health care provider may recommend that you be screened regularly for cancer of the pelvic organs. These include your ovaries, uterus, and vagina. This screening involves a pelvic exam, which includes checking for microscopic changes to the surface of your cervix (Pap test).  For women ages 21-65, health care providers may recommend a pelvic exam and a Pap test every three years. For women ages 64-65, they may recommend the Pap test and pelvic exam, combined with testing for human papilloma virus (HPV), every five years. Some types of HPV increase your risk of cervical cancer. Testing for HPV may also be done on women of any age  who have unclear Pap test results.  Other health care providers may not recommend any screening for nonpregnant women who are considered low risk for pelvic cancer and have no symptoms. Ask your health care provider if a screening pelvic exam is right for you.  If you have had past treatment for cervical cancer or a condition that could lead to cancer, you need Pap tests and screening for cancer for at least 20 years after your treatment. If Pap tests have been discontinued for you, your risk factors (such as having a new sexual partner) need to be reassessed to determine if you should start having screenings again. Some women have medical problems that increase the chance of getting cervical cancer. In these cases, your health care provider may recommend that you have screening and Pap tests more often.  If you have a family history of uterine cancer or ovarian cancer, talk with your health care provider about genetic screening.  If you have vaginal bleeding after reaching menopause, tell your health care provider.  There are currently no reliable tests available to screen for ovarian cancer. Lung Cancer  Lung cancer screening is recommended for adults 14-32 years old who are at high risk for lung cancer because of a history of  smoking. A yearly low-dose CT scan of the lungs is recommended if you:  Currently smoke.  Have a history of at least 30 pack-years of smoking and you currently smoke or have quit within the past 15 years. A pack-year is smoking an average of one pack of cigarettes per day for one year. Yearly screening should:  Continue until it has been 15 years since you quit.  Stop if you develop a health problem that would prevent you from having lung cancer treatment. Colorectal Cancer  This type of cancer can be detected and can often be prevented.  Routine colorectal cancer screening usually begins at age 9 and continues through age 36.  If you have risk factors for colon  cancer, your health care provider may recommend that you be screened at an earlier age.  If you have a family history of colorectal cancer, talk with your health care provider about genetic screening.  Your health care provider may also recommend using home test kits to check for hidden blood in your stool.  A small camera at the end of a tube can be used to examine your colon directly (sigmoidoscopy or colonoscopy). This is done to check for the earliest forms of colorectal cancer.  Direct examination of the colon should be repeated every 5-10 years until age 61. However, if early forms of precancerous polyps or small growths are found or if you have a family history or genetic risk for colorectal cancer, you may need to be screened more often. Skin Cancer  Check your skin from head to toe regularly.  Monitor any moles. Be sure to tell your health care provider:  About any new moles or changes in moles, especially if there is a change in a mole's shape or color.  If you have a mole that is larger than the size of a pencil eraser.  If any of your family members has a history of skin cancer, especially at a young age, talk with your health care provider about genetic screening.  Always use sunscreen. Apply sunscreen liberally and repeatedly throughout the day.  Whenever you are outside, protect yourself by wearing long sleeves, pants, a wide-brimmed hat, and sunglasses. What should I know about osteoporosis? Osteoporosis is a condition in which bone destruction happens more quickly than new bone creation. After menopause, you may be at an increased risk for osteoporosis. To help prevent osteoporosis or the bone fractures that can happen because of osteoporosis, the following is recommended:  If you are 75-48 years old, get at least 1,000 mg of calcium and at least 600 mg of vitamin D per day.  If you are older than age 4 but younger than age 48, get at least 1,200 mg of calcium and at  least 600 mg of vitamin D per day.  If you are older than age 102, get at least 1,200 mg of calcium and at least 800 mg of vitamin D per day. Smoking and excessive alcohol intake increase the risk of osteoporosis. Eat foods that are rich in calcium and vitamin D, and do weight-bearing exercises several times each week as directed by your health care provider. What should I know about how menopause affects my mental health? Depression may occur at any age, but it is more common as you become older. Common symptoms of depression include:  Low or sad mood.  Changes in sleep patterns.  Changes in appetite or eating patterns.  Feeling an overall lack of motivation or enjoyment of activities  that you previously enjoyed.  Frequent crying spells. Talk with your health care provider if you think that you are experiencing depression. What should I know about immunizations? It is important that you get and maintain your immunizations. These include:  Tetanus, diphtheria, and pertussis (Tdap) booster vaccine.  Influenza every year before the flu season begins.  Pneumonia vaccine.  Shingles vaccine. Your health care provider may also recommend other immunizations. This information is not intended to replace advice given to you by your health care provider. Make sure you discuss any questions you have with your health care provider. Document Released: 01/01/2006 Document Revised: 05/29/2016 Document Reviewed: 08/13/2015 Elsevier Interactive Patient Education  2017 [employer].

## 2017-04-07 LAB — COMPREHENSIVE METABOLIC PANEL
ALBUMIN: 4.4 g/dL (ref 3.5–5.2)
ALT: 26 U/L (ref 0–35)
AST: 26 U/L (ref 0–37)
Alkaline Phosphatase: 56 U/L (ref 39–117)
BUN: 12 mg/dL (ref 6–23)
CHLORIDE: 104 meq/L (ref 96–112)
CO2: 31 meq/L (ref 19–32)
Calcium: 9.8 mg/dL (ref 8.4–10.5)
Creatinine, Ser: 0.68 mg/dL (ref 0.40–1.20)
GFR: 93.35 mL/min (ref >60.00)
GLUCOSE: 106 mg/dL — AB (ref 70–99)
POTASSIUM: 4 meq/L (ref 3.5–5.1)
Sodium: 140 mEq/L (ref 135–145)
Total Bilirubin: 0.3 mg/dL (ref 0.2–1.2)
Total Protein: 7.5 g/dL (ref 6.0–8.3)

## 2017-04-07 LAB — CBC WITH DIFFERENTIAL/PLATELET
Basophils Absolute: 0.1 10*3/uL (ref 0.0–0.1)
Basophils Relative: 1.1 % (ref 0.0–3.0)
EOS PCT: 2 % (ref 0.0–5.0)
Eosinophils Absolute: 0.1 10*3/uL (ref 0.0–0.7)
HCT: 38.8 % (ref 36.0–46.0)
Hemoglobin: 12.9 g/dL (ref 12.0–15.0)
LYMPHS ABS: 1.6 10*3/uL (ref 0.7–4.0)
Lymphocytes Relative: 36.9 % (ref 12.0–46.0)
MCHC: 33.3 g/dL (ref 30.0–36.0)
MCV: 95.5 fl (ref 78.0–100.0)
MONOS PCT: 13.8 % — AB (ref 3.0–12.0)
Monocytes Absolute: 0.6 10*3/uL (ref 0.1–1.0)
Neutro Abs: 2 10*3/uL (ref 1.4–7.7)
Neutrophils Relative %: 46.2 % (ref 43.0–77.0)
Platelets: 246 10*3/uL (ref 150.0–400.0)
RBC: 4.06 Mil/uL (ref 3.87–5.11)
RDW: 14.1 % (ref 11.5–15.5)
WBC: 4.4 10*3/uL (ref 4.0–10.5)

## 2017-04-07 LAB — LIPID PANEL
CHOL/HDL RATIO: 3
Cholesterol: 172 mg/dL (ref 0–200)
HDL: 51.5 mg/dL (ref >39.00)
LDL CALC: 89 mg/dL (ref 0–99)
NONHDL: 120.03
Triglycerides: 155 mg/dL — ABNORMAL HIGH (ref 0.0–149.0)
VLDL: 31 mg/dL (ref 0.0–40.0)

## 2017-04-07 LAB — LDL CHOLESTEROL, DIRECT: Direct LDL: 96 mg/dL

## 2017-04-07 LAB — TSH: TSH: 0.23 u[IU]/mL — ABNORMAL LOW (ref 0.35–4.50)

## 2017-04-08 ENCOUNTER — Encounter: Payer: Self-pay | Admitting: Internal Medicine

## 2017-04-08 LAB — CYTOLOGY - PAP
DIAGNOSIS: NEGATIVE
HPV: NOT DETECTED

## 2017-04-08 MED ORDER — LEVOTHYROXINE SODIUM 50 MCG PO TABS: 50.0000 ug | ORAL_TABLET | Freq: Every day | ORAL | 0 refills | Status: DC

## 2017-04-08 NOTE — Assessment & Plan Note (Addendum)
Treated with Imuran x 3 + years.  Repeat liver enzymes are normal. .  Annual Follow up with Dr. Drue Novel has not been done, per patient preference.    She prefera to follow up with me and continue Imuran. Hepatitis A&B vaccines up to date .    Lab Results  Component Value Date   ALT 26 04/06/2017   AST 26 04/06/2017   ALKPHOS 56 04/06/2017   BILITOT 0.3 04/06/2017

## 2017-04-08 NOTE — Assessment & Plan Note (Addendum)
Thyroid function is overactive on current dose.  I have sent a lower dose of levothyroxine to her pharmacy and would like patient to change immediately, repeat TSH in 6 week s.  Lab Results  Component Value Date   TSH 0.23 (L) 04/06/2017

## 2017-04-08 NOTE — Assessment & Plan Note (Signed)
Annual comprehensive preventive exam was done as well as an evaluation and management of chronic conditions .  During the course of the visit the patient was educated and counseled about appropriate screening and preventive services including :  diabetes screening, lipid analysis with projected  10 year  risk for CAD , nutrition counseling, breast, cervical and colorectal cancer screening, and recommended immunizations.  Printed recommendations for health maintenance screenings was given 

## 2017-04-08 NOTE — Assessment & Plan Note (Addendum)
Historically Well controlled on current regimen. Liver enzymes rer normal, no changes today.  Lab Results  Component Value Date   CHOL 172 04/06/2017   HDL 51.50 04/06/2017   LDLCALC 89 04/06/2017   LDLDIRECT 96.0 04/06/2017   TRIG 155.0 (H) 04/06/2017   CHOLHDL 3 04/06/2017   Lab Results  Component Value Date   ALT 26 04/06/2017   AST 26 04/06/2017   ALKPHOS 56 04/06/2017   BILITOT 0.3 04/06/2017

## 2017-04-22 NOTE — Telephone Encounter (Signed)
Mailed unread message to patient, thanks 

## 2017-04-24 ENCOUNTER — Other Ambulatory Visit: Payer: Self-pay | Admitting: Internal Medicine

## 2017-05-18 ENCOUNTER — Ambulatory Visit
Admission: RE | Admit: 2017-05-18 | Discharge: 2017-05-18 | Disposition: A | Discharge status: Home / Self Care | Payer: Managed Care, Other (non HMO) | Source: Ambulatory Visit | Attending: Internal Medicine | Admitting: Internal Medicine

## 2017-05-18 DIAGNOSIS — Z1231 Encounter for screening mammogram for malignant neoplasm of breast: Secondary | ICD-10-CM | POA: Diagnosis present

## 2017-05-18 DIAGNOSIS — Z1239 Encounter for other screening for malignant neoplasm of breast: Secondary | ICD-10-CM

## 2017-05-28 ENCOUNTER — Telehealth: Payer: Self-pay | Admitting: Internal Medicine

## 2017-05-28 MED ORDER — ZOSTER VAC RECOMB ADJUVANTED 50 MCG/0.5ML IM SUSR: 0.5000 mL | Freq: Once | INTRAMUSCULAR | 0 refills | Status: DC

## 2017-05-28 NOTE — Telephone Encounter (Signed)
Please advise 

## 2017-05-28 NOTE — Telephone Encounter (Signed)
Pt called wanting to get a Rx for shingles injection called to her pharmacy. Not the live virus.   [place], [place] also would like the colo guard mailed to her home.   Call pt @ [phone]. Thank you!

## 2017-05-28 NOTE — Telephone Encounter (Signed)
Please handle the cologuard order.   shingrix rx sent

## 2017-05-28 NOTE — Telephone Encounter (Signed)
cologuard order signed and faxed.

## 2017-07-21 ENCOUNTER — Telehealth: Payer: Self-pay | Admitting: Internal Medicine

## 2017-07-21 NOTE — Telephone Encounter (Signed)
Pt called wondering if her cologuard results have come back. Please advise, thank you!  Call pt @ [phone]

## 2017-07-21 NOTE — Telephone Encounter (Signed)
Spoke with pt and informed her of the message below. Pt stated that she would give them a call so that they can send her a new kit. Gave the pt the number to contact.

## 2017-07-21 NOTE — Telephone Encounter (Signed)
Left message to call back. Need to let pt know that I spoke with [employer] who the stool sample is sent to for testing and they stated that the sample was over 26 hours old so they could not test it. However they stated that the pt was not charged for this and that they would need to speak with her to let her know what all happened and that they will be willing to send her a new kit.

## 2017-07-28 ENCOUNTER — Other Ambulatory Visit: Payer: Self-pay | Admitting: Internal Medicine

## 2017-08-17 LAB — COLOGUARD: Cologuard: NEGATIVE

## 2017-08-20 ENCOUNTER — Telehealth: Payer: Self-pay | Admitting: Internal Medicine

## 2017-08-20 NOTE — Telephone Encounter (Signed)
LMTCB

## 2017-08-20 NOTE — Telephone Encounter (Signed)
The results of patient's cologuard is negative.   We will repeat every 3 years For colon CA screening. Please abstract and notify patient

## 2017-08-20 NOTE — Telephone Encounter (Signed)
Pt called back returning your call. Thank you! °

## 2017-08-23 NOTE — Telephone Encounter (Signed)
Spoke with pt and informed her of her lab results. Abstracted cologuard.

## 2017-10-18 ENCOUNTER — Ambulatory Visit: Payer: Self-pay | Admitting: *Deleted

## 2017-10-18 NOTE — Telephone Encounter (Signed)
Started sudafed, claritin, and net-I-pot; blows nose a lot and bad cough; sore throat the 1st few days of episode  Answer Assessment - Initial Assessment Questions 1. LOCATION: "Where does it hurt?"      Sinus pressure under eyes 2. ONSET: "When did the sinus pain start?"  (e.g., hours, days)      October 11, 2017 3. SEVERITY: "How bad is the pain?"   (Scale 1-10; mild, moderate or severe)   - MILD (1-3): doesn't interfere with normal activities    - MODERATE (4-7): interferes with normal activities (e.g., work or school) or awakens from sleep   - SEVERE (8-10): excruciating pain and patient unable to do any normal activities        moderate 4. RECURRENT SYMPTOM: "Have you ever had sinus problems before?" If so, ask: "When was the last time?" and "What happened that time?"      no 5. NASAL CONGESTION: "Is the nose blocked?" If so, ask, "Can you open it or must you breathe through the mouth?"     yes 6. NASAL DISCHARGE: "Do you have discharge from your nose?" If so ask, "What color?"     Clear but occasional greenish tint with bloody discharge 7. FEVER: "Do you have a fever?" If so, ask: "What is it, how was it measured, and when did it start?"      no 8. OTHER SYMPTOMS: "Do you have any other symptoms?" (e.g., sore throat, cough, earache, difficulty breathing)     Dry cough 9. PREGNANCY: "Is there any chance you are pregnant?" "When was your last menstrual period?"   no  Protocols used: SINUS PAIN OR CONGESTION-A-AH

## 2017-10-19 ENCOUNTER — Ambulatory Visit (INDEPENDENT_AMBULATORY_CARE_PROVIDER_SITE_OTHER): Payer: Managed Care, Other (non HMO) | Admitting: Internal Medicine

## 2017-10-19 ENCOUNTER — Encounter: Payer: Self-pay | Admitting: Internal Medicine

## 2017-10-19 VITALS — BP 122/76 | HR 70 | Temp 98.2°F | Resp 15 | Ht 62.5 in | Wt 135.6 lb

## 2017-10-19 DIAGNOSIS — E785 Hyperlipidemia, unspecified: Secondary | ICD-10-CM

## 2017-10-19 DIAGNOSIS — B349 Viral infection, unspecified: Secondary | ICD-10-CM | POA: Diagnosis not present

## 2017-10-19 DIAGNOSIS — K754 Autoimmune hepatitis: Secondary | ICD-10-CM | POA: Diagnosis not present

## 2017-10-19 DIAGNOSIS — E034 Atrophy of thyroid (acquired): Secondary | ICD-10-CM

## 2017-10-19 DIAGNOSIS — J029 Acute pharyngitis, unspecified: Secondary | ICD-10-CM

## 2017-10-19 MED ORDER — BENZONATATE 200 MG PO CAPS: 200.0000 mg | ORAL_CAPSULE | Freq: Three times a day (TID) | ORAL | 1 refills | Status: DC | PRN

## 2017-10-19 MED ORDER — PREDNISONE 10 MG PO TABS: ORAL_TABLET | ORAL | 0 refills | Status: DC

## 2017-10-19 MED ORDER — AMOXICILLIN-POT CLAVULANATE 875-125 MG PO TABS: 1.0000 | ORAL_TABLET | Freq: Two times a day (BID) | ORAL | 0 refills | Status: DC

## 2017-10-19 NOTE — Progress Notes (Signed)
Subjective:  Patient ID: Krista Olson, female    DOB: 08-03-1956  Age: 61 y.o. MRN: [id number]  CC: The primary encounter diagnosis was Hypothyroidism due to acquired atrophy of thyroid. Diagnoses of Hyperlipidemia LDL goal <160, Autoimmune hepatitis ([place]), and Pharyngitis with viral syndrome were also pertinent to this visit.  HPI Krista Olson presents for evaluation of sinus congestion and drainaige,  sympotms ave been present for  1.5 weeks,  No facial pain ,fevers or ear pain ,  Mild pharyngitis,  Right eye watering btn discharge.   Follow up on hyperlipidemia,  hypothyroid,  Dose reduced in May for suppressed tsh.  Tolerating medications without side effects.  Exercising regularly       Outpatient Medications Prior to Visit  Medication Sig Dispense Refill  . atorvastatin (LIPITOR) 10 MG tablet TAKE 1 TABLET BY MOUTH ONCE DAILY *NEED APPOINTMENT FOR FURTHER REFILLS* 30 tablet 5  . azaTHIOprine (IMURAN) 50 MG tablet Take 50 mg by mouth daily.    . Calcium Carbonate-Vitamin D (CALCIUM + D PO) Take 1 tablet by mouth daily.    . fish oil-omega-3 fatty acids 1000 MG capsule Take 1 capsule by mouth daily.    Marland Kitchen levothyroxine (SYNTHROID, LEVOTHROID) 50 MCG tablet TAKE 1 TABLET BY MOUTH ONCE DAILY ON AN EMPTY STOMACH. WAIT 30 MINUTES BEFORE TAKING OTHER MEDS. 90 tablet 1  . Multiple Vitamin (MULTIVITAMIN) tablet Take 1 tablet by mouth daily.    . ciprofloxacin (CIPRO) 500 MG tablet Take 1 tablet (500 mg total) by mouth 2 (two) times daily. (Patient not taking: Reported on 10/19/2017) 20 tablet 0   No facility-administered medications prior to visit.     Review of Systems;  Patient denies headache, fevers, malaise, unintentional weight loss, skin rash, eye pain, sinus congestion and sinus pain, sore throat, dysphagia,  hemoptysis , cough, dyspnea, wheezing, chest pain, palpitations, orthopnea, edema, abdominal pain, nausea, melena, diarrhea, constipation, flank pain, dysuria, hematuria,  urinary  Frequency, nocturia, numbness, tingling, seizures,  Focal weakness, Loss of consciousness,  Tremor, insomnia, depression, anxiety, and suicidal ideation.      Objective:  BP 122/76 (BP Location: Left Arm, Patient Position: Sitting, Cuff Size: Normal)   Pulse 70   Temp 98.2 F (36.8 C) (Oral)   Resp 15   Ht 5' 2.5" (1.588 m)   Wt 135 lb 9.6 oz (61.5 kg)   SpO2 95%   BMI 24.41 kg/m   BP Readings from Last 3 Encounters:  10/19/17 122/76  04/06/17 130/84  04/03/16 120/80    Wt Readings from Last 3 Encounters:  10/19/17 135 lb 9.6 oz (61.5 kg)  04/06/17 130 lb (59 kg)  04/03/16 135 lb 6.4 oz (61.4 kg)    General appearance: alert, cooperative and appears stated age Ears: normal TM's and external ear canals both ears Throat: lips, mucosa, and tongue normal; teeth and gums normal Neck: no adenopathy, no carotid bruit, supple, symmetrical, trachea midline and thyroid not enlarged, symmetric, no tenderness/mass/nodules Back: symmetric, no curvature. ROM normal. No CVA tenderness. Lungs: clear to auscultation bilaterally Heart: regular rate and rhythm, S1, S2 normal, no murmur, click, rub or gallop Abdomen: soft, non-tender; bowel sounds normal; no masses,  no organomegaly Pulses: 2+ and symmetric Skin: Skin color, texture, turgor normal. No rashes or lesions Lymph nodes: Cervical, supraclavicular, and axillary nodes normal.  No results found for: HGBA1C  Lab Results  Component Value Date   CREATININE 0.71 10/19/2017   CREATININE 0.68 04/06/2017   CREATININE 0.66 04/03/2016  Lab Results  Component Value Date   WBC 6.1 10/19/2017   HGB 13.9 10/19/2017   HCT 42.0 10/19/2017   PLT 268.0 10/19/2017   GLUCOSE 91 10/19/2017   CHOL 172 04/06/2017   TRIG 155.0 (H) 04/06/2017   HDL 51.50 04/06/2017   LDLDIRECT 149.0 10/19/2017   LDLCALC 89 04/06/2017   ALT 19 10/19/2017   AST 23 10/19/2017   NA 140 10/19/2017   K 4.5 10/19/2017   CL 102 10/19/2017    CREATININE 0.71 10/19/2017   BUN 14 10/19/2017   CO2 30 10/19/2017   TSH 4.50 10/19/2017    Mm Screening Breast Tomo Bilateral  Result Date: 05/18/2017 CLINICAL DATA:  Screening. EXAM: 2D DIGITAL SCREENING BILATERAL MAMMOGRAM WITH CAD AND ADJUNCT TOMO COMPARISON:  Previous exam(s). ACR Breast Density Category b: There are scattered areas of fibroglandular density. FINDINGS: There are no findings suspicious for malignancy. Images were processed with CAD. IMPRESSION: No mammographic evidence of malignancy. A result letter of this screening mammogram will be mailed directly to the patient. RECOMMENDATION: Screening mammogram in one year. (Code:SM-B-01Y) BI-RADS CATEGORY  1: Negative. Electronically Signed   By: Fidela Salisbury M.D.   On: 05/18/2017 14:03    Assessment & Plan:   Problem List Items Addressed This Visit    Autoimmune hepatitis ([place])    Treated with Imuran x 3 + years.  Repeat liver enzymes are normal. .  Annual Follow up with Dr. Drue Novel has not been done, per patient preference.    She prefers to follow up with me and continue Imuran. Hepatitis A&B vaccines up to date .    Lab Results  Component Value Date   ALT 19 10/19/2017   AST 23 10/19/2017   ALKPHOS 59 10/19/2017   BILITOT 0.5 10/19/2017               Relevant Orders   Comprehensive metabolic panel (Completed)   CBC with Differential/Platelet (Completed)   Hyperlipidemia LDL goal <160    Historically Well controlled on current regimen. Liver enzymes are normal, no changes today.  Lab Results  Component Value Date   CHOL 172 04/06/2017   HDL 51.50 04/06/2017   LDLCALC 89 04/06/2017   LDLDIRECT 149.0 10/19/2017   TRIG 155.0 (H) 04/06/2017   CHOLHDL 3 04/06/2017   Lab Results  Component Value Date   ALT 19 10/19/2017   AST 23 10/19/2017   ALKPHOS 59 10/19/2017   BILITOT 0.5 10/19/2017               Relevant Orders   LDL cholesterol, direct (Completed)   Hypothyroidism - Primary     Dose was reduced in may from 75 mcg to 50 mcg for suppressed tsh.  Tsh is now therapeutic   Lab Results  Component Value Date   TSH 4.50 10/19/2017         Relevant Orders   TSH (Completed)   Pharyngitis with viral syndrome    Her symptoms do not suggest that she has a bacterial infection .  Rx prednisone taper and a cough suppressant  Add abx if  Fevers or facial pain  develop         I have discontinued Hadyn M. Delira's ciprofloxacin. I am also having her start on predniSONE, benzonatate, and amoxicillin-clavulanate. Additionally, I am having her maintain her azaTHIOprine, Calcium Carbonate-Vitamin D (CALCIUM + D PO), multivitamin, fish oil-omega-3 fatty acids, atorvastatin, and levothyroxine.  Meds ordered this encounter  Medications  . predniSONE (DELTASONE) 10 MG  tablet    Sig: 6 tablets on Day 1 , then reduce by 1 tablet daily until gone    Dispense:  21 tablet    Refill:  0  . benzonatate (TESSALON) 200 MG capsule    Sig: Take 1 capsule (200 mg total) by mouth 3 (three) times daily as needed for cough.    Dispense:  60 capsule    Refill:  1  . amoxicillin-clavulanate (AUGMENTIN) 875-125 MG tablet    Sig: Take 1 tablet by mouth 2 (two) times daily.    Dispense:  14 tablet    Refill:  0    Medications Discontinued During This Encounter  Medication Reason  . ciprofloxacin (CIPRO) 500 MG tablet Completed Course    Follow-up: No Follow-up on file.   Crecencio Mc, MD

## 2017-10-19 NOTE — Patient Instructions (Signed)
You have a viral  Syndrome .  The post nasal drip is causing your sore throat and cough .  Flush your sinuses once or  twice daily with  Netti Pot.  Continue claritin for daytime allergies Use benadryl 25 mg at bedtime  for the post nasal drip and sudafed PE every 6hours or Afrin nasal spray every 12 hours as needed for the congestion..Tessalon cough capsules for the cough Prednisone taper for the inflammation    If you develop fever (T > 100.4) ,  Green or recurrent blood streaked nasal discharge,  Ear or facial pain,  Start the augmentin antibiotic    If you start the augmentin Please take a probiotic daily  ( Align, [place] or [employer]), the generic version of one of these over the counter medications,  Yogurt, or another dietary source for a minimum of 3 weeks to prevent a serious antibiotic associated diarrhea  Called clostridium dificile colitis.  Taking a probiotic may also prevent vaginitis due to yeast infections and can be continued indefinitely if you feel that it improves your digestion or your elimination (bowels).Marland Kitchen

## 2017-10-20 ENCOUNTER — Encounter: Payer: Self-pay | Admitting: Internal Medicine

## 2017-10-20 DIAGNOSIS — J029 Acute pharyngitis, unspecified: Secondary | ICD-10-CM

## 2017-10-20 DIAGNOSIS — B349 Viral infection, unspecified: Secondary | ICD-10-CM | POA: Insufficient documentation

## 2017-10-20 LAB — COMPREHENSIVE METABOLIC PANEL
ALBUMIN: 4.6 g/dL (ref 3.5–5.2)
ALK PHOS: 59 U/L (ref 39–117)
ALT: 19 U/L (ref 0–35)
AST: 23 U/L (ref 0–37)
BILIRUBIN TOTAL: 0.5 mg/dL (ref 0.2–1.2)
BUN: 14 mg/dL (ref 6–23)
CALCIUM: 10.1 mg/dL (ref 8.4–10.5)
CO2: 30 mEq/L (ref 19–32)
CREATININE: 0.71 mg/dL (ref 0.40–1.20)
Chloride: 102 mEq/L (ref 96–112)
GFR: 88.66 mL/min (ref >60.00)
Glucose, Bld: 91 mg/dL (ref 70–99)
Potassium: 4.5 mEq/L (ref 3.5–5.1)
Sodium: 140 mEq/L (ref 135–145)
TOTAL PROTEIN: 8.1 g/dL (ref 6.0–8.3)

## 2017-10-20 LAB — CBC WITH DIFFERENTIAL/PLATELET
BASOS ABS: 0.1 10*3/uL (ref 0.0–0.1)
Basophils Relative: 1 % (ref 0.0–3.0)
EOS ABS: 0.1 10*3/uL (ref 0.0–0.7)
Eosinophils Relative: 1.7 % (ref 0.0–5.0)
HEMATOCRIT: 42 % (ref 36.0–46.0)
HEMOGLOBIN: 13.9 g/dL (ref 12.0–15.0)
LYMPHS PCT: 33.6 % (ref 12.0–46.0)
Lymphs Abs: 2 10*3/uL (ref 0.7–4.0)
MCHC: 33 g/dL (ref 30.0–36.0)
MCV: 97.1 fl (ref 78.0–100.0)
MONOS PCT: 9.4 % (ref 3.0–12.0)
Monocytes Absolute: 0.6 10*3/uL (ref 0.1–1.0)
NEUTROS ABS: 3.3 10*3/uL (ref 1.4–7.7)
Neutrophils Relative %: 54.3 % (ref 43.0–77.0)
PLATELETS: 268 10*3/uL (ref 150.0–400.0)
RBC: 4.33 Mil/uL (ref 3.87–5.11)
RDW: 13.6 % (ref 11.5–15.5)
WBC: 6.1 10*3/uL (ref 4.0–10.5)

## 2017-10-20 LAB — LDL CHOLESTEROL, DIRECT: Direct LDL: 149 mg/dL

## 2017-10-20 LAB — TSH: TSH: 4.5 u[IU]/mL (ref 0.35–4.50)

## 2017-10-20 NOTE — Assessment & Plan Note (Signed)
Dose was reduced in may from 75 mcg to 50 mcg for suppressed tsh.  Tsh is now therapeutic   Lab Results  Component Value Date   TSH 4.50 10/19/2017

## 2017-10-20 NOTE — Assessment & Plan Note (Signed)
Historically Well controlled on current regimen. Liver enzymes are normal, no changes today.  Lab Results  Component Value Date   CHOL 172 04/06/2017   HDL 51.50 04/06/2017   LDLCALC 89 04/06/2017   LDLDIRECT 149.0 10/19/2017   TRIG 155.0 (H) 04/06/2017   CHOLHDL 3 04/06/2017   Lab Results  Component Value Date   ALT 19 10/19/2017   AST 23 10/19/2017   ALKPHOS 59 10/19/2017   BILITOT 0.5 10/19/2017

## 2017-10-20 NOTE — Assessment & Plan Note (Signed)
Her symptoms do not suggest that she has a bacterial infection .  Rx prednisone taper and a cough suppressant  Add abx if  Fevers or facial pain  develop

## 2017-10-20 NOTE — Assessment & Plan Note (Signed)
Treated with Imuran x 3 + years.  Repeat liver enzymes are normal. .  Annual Follow up with Dr. Drue Novel has not been done, per patient preference.    She prefers to follow up with me and continue Imuran. Hepatitis A&B vaccines up to date .    Lab Results  Component Value Date   ALT 19 10/19/2017   AST 23 10/19/2017   ALKPHOS 59 10/19/2017   BILITOT 0.5 10/19/2017

## 2017-10-28 ENCOUNTER — Other Ambulatory Visit: Payer: Self-pay | Admitting: Internal Medicine

## 2017-11-03 ENCOUNTER — Other Ambulatory Visit: Payer: Self-pay | Admitting: Internal Medicine

## 2017-11-03 MED ORDER — AZATHIOPRINE 50 MG PO TABS: 50.0000 mg | ORAL_TABLET | Freq: Every day | ORAL | 5 refills | Status: DC

## 2017-11-03 NOTE — Telephone Encounter (Signed)
OK , REFILLED

## 2017-11-03 NOTE — Telephone Encounter (Signed)
Refilled: does not look like we have ever filled this medication.  Last OV: 10/19/2017 Next OV: not scheduled

## 2017-11-03 NOTE — Telephone Encounter (Signed)
Copied from [place] [id number]. Topic: Quick Communication - Rx Refill/Question >> Nov 03, 2017 10:23 AM Robina Ade, Helene Kelp D wrote: Has the patient contacted their pharmacy?Yes (Agent: If no, request that the patient contact the pharmacy for the refill.) Preferred Pharmacy (with phone number or street name): [facility], [place]. Agent: Please be advised that RX refills may take up to 3 business days. We ask that you follow-up with your pharmacy. Patient needs refill on her azaTHIOprine (IMURAN) 50 MG tablet she is completely out.

## 2017-11-03 NOTE — Telephone Encounter (Signed)
Please advise 

## 2017-11-03 NOTE — Telephone Encounter (Signed)
Please advise on Imuran. LOV 10/19/17.

## 2017-11-29 ENCOUNTER — Telehealth: Payer: Self-pay | Admitting: Internal Medicine

## 2017-11-29 NOTE — Telephone Encounter (Signed)
Please advise 

## 2017-11-29 NOTE — Telephone Encounter (Signed)
Pt called in to inquire about her second shingles injection.. Pt says that she have received the first one.   Please advise.   CB: [phone]

## 2017-11-30 ENCOUNTER — Other Ambulatory Visit: Payer: Self-pay

## 2017-11-30 MED ORDER — ZOSTER VAC RECOMB ADJUVANTED 50 MCG/0.5ML IM SUSR: 0.5000 mL | Freq: Once | INTRAMUSCULAR | 0 refills | Status: AC

## 2017-11-30 NOTE — Telephone Encounter (Signed)
Spoke with pt and she stated that when she took her rx for the shingrix vaccine to pharmacy there was not a refill for her to be able to get the second dose. I have faxed over the rx for the second dose to [facility] requested by the pt.

## 2017-12-23 ENCOUNTER — Ambulatory Visit: Payer: Self-pay | Admitting: *Deleted

## 2017-12-23 NOTE — Telephone Encounter (Signed)
t says that she has been having congestion for the past 5 days; it started in her chest and now she is having nasal congestion; she states that she has been using her neti pot pt and has been taking sudafed; pt is requesting advice; advice for given per nurse triage and pt verbalizes understanding; she also states that if she is not feeling better she will call back.     Reason for Disposition . Cold with no complications  Answer Assessment - Initial Assessment Questions 1. ONSET: "When did the nasal discharge start?"      This am 12/23/17 2. AMOUNT: "How much discharge is there?"      no 3. COUGH: "Do you have a cough?" If yes, ask: "Describe the color of your sputum" (clear, white, yellow, green)     cleat 4. RESPIRATORY DISTRESS: "Describe your breathing."      no 5. FEVER: "Do you have a fever?" If so, ask: "What is your temperature, how was it measured, and when did it start?"     no 6. SEVERITY: "Overall, how bad are you feeling right now?" (e.g., doesn't interfere with normal activities, staying home from school/work, staying in bed)      Does not interfere 7. OTHER SYMPTOMS: "Do you have any other symptoms?" (e.g., sore throat, earache, wheezing, vomiting)     Throat and chest congestion 8. PREGNANCY: "Is there any chance you are pregnant?" "When was your last menstrual period?"     No  Protocols used: COMMON COLD-A-AH

## 2017-12-29 ENCOUNTER — Other Ambulatory Visit: Payer: Self-pay | Admitting: Internal Medicine

## 2018-04-30 ENCOUNTER — Other Ambulatory Visit: Payer: Self-pay | Admitting: Internal Medicine

## 2018-05-30 ENCOUNTER — Ambulatory Visit (INDEPENDENT_AMBULATORY_CARE_PROVIDER_SITE_OTHER): Payer: Managed Care, Other (non HMO) | Admitting: Internal Medicine

## 2018-05-30 ENCOUNTER — Encounter: Payer: Self-pay | Admitting: Internal Medicine

## 2018-05-30 VITALS — BP 120/84 | HR 74 | Temp 98.2°F | Resp 14 | Ht 62.5 in | Wt 136.8 lb

## 2018-05-30 DIAGNOSIS — K754 Autoimmune hepatitis: Secondary | ICD-10-CM | POA: Diagnosis not present

## 2018-05-30 DIAGNOSIS — E785 Hyperlipidemia, unspecified: Secondary | ICD-10-CM | POA: Diagnosis not present

## 2018-05-30 DIAGNOSIS — Z1231 Encounter for screening mammogram for malignant neoplasm of breast: Secondary | ICD-10-CM

## 2018-05-30 DIAGNOSIS — Z0000 Encounter for general adult medical examination without abnormal findings: Secondary | ICD-10-CM

## 2018-05-30 DIAGNOSIS — Z1239 Encounter for other screening for malignant neoplasm of breast: Secondary | ICD-10-CM

## 2018-05-30 DIAGNOSIS — E034 Atrophy of thyroid (acquired): Secondary | ICD-10-CM | POA: Diagnosis not present

## 2018-05-30 LAB — CBC WITH DIFFERENTIAL/PLATELET
Basophils Absolute: 0 10*3/uL (ref 0.0–0.1)
Basophils Relative: 0.8 % (ref 0.0–3.0)
EOS PCT: 1.7 % (ref 0.0–5.0)
Eosinophils Absolute: 0.1 10*3/uL (ref 0.0–0.7)
HCT: 40.5 % (ref 36.0–46.0)
HEMOGLOBIN: 13.6 g/dL (ref 12.0–15.0)
LYMPHS ABS: 1.9 10*3/uL (ref 0.7–4.0)
Lymphocytes Relative: 35.9 % (ref 12.0–46.0)
MCHC: 33.5 g/dL (ref 30.0–36.0)
MCV: 95.2 fl (ref 78.0–100.0)
MONO ABS: 0.6 10*3/uL (ref 0.1–1.0)
Monocytes Relative: 10.6 % (ref 3.0–12.0)
Neutro Abs: 2.8 10*3/uL (ref 1.4–7.7)
Neutrophils Relative %: 51 % (ref 43.0–77.0)
Platelets: 250 10*3/uL (ref 150.0–400.0)
RBC: 4.26 Mil/uL (ref 3.87–5.11)
RDW: 13.7 % (ref 11.5–15.5)
WBC: 5.4 10*3/uL (ref 4.0–10.5)

## 2018-05-30 LAB — COMPREHENSIVE METABOLIC PANEL
ALT: 21 U/L (ref 0–35)
AST: 23 U/L (ref 0–37)
Albumin: 4.5 g/dL (ref 3.5–5.2)
Alkaline Phosphatase: 55 U/L (ref 39–117)
BUN: 12 mg/dL (ref 6–23)
CHLORIDE: 105 meq/L (ref 96–112)
CO2: 28 mEq/L (ref 19–32)
Calcium: 9.8 mg/dL (ref 8.4–10.5)
Creatinine, Ser: 0.73 mg/dL (ref 0.40–1.20)
GFR: 85.69 mL/min (ref >60.00)
GLUCOSE: 100 mg/dL — AB (ref 70–99)
POTASSIUM: 4.6 meq/L (ref 3.5–5.1)
SODIUM: 140 meq/L (ref 135–145)
Total Bilirubin: 0.5 mg/dL (ref 0.2–1.2)
Total Protein: 7.6 g/dL (ref 6.0–8.3)

## 2018-05-30 LAB — TSH: TSH: 4.24 u[IU]/mL (ref 0.35–4.50)

## 2018-05-30 NOTE — Progress Notes (Signed)
Patient ID: Krista Olson, female    DOB: 1956-02-16  Age: 62 y.o. MRN: [id number]  The patient is here for annual physical examination and management of other chronic and acute problems.  PAP SMEAR NORMAL May 2018, March 2014 cologuard negative 2018 Had one Shingrx vaccine August 2018,  Pharmacy  never got her the second dose at [facility] in [place]       The risk factors are reflected in the social history.  The roster of all physicians providing medical care to patient - is listed in the Snapshot section of the chart.  Activities of daily living:  The patient is 100% independent in all ADLs: dressing, toileting, feeding as well as independent mobility  Home safety : The patient has smoke detectors in the home. They wear seatbelts.  There are no firearms at home. There is no violence in the home.   There is no risks for hepatitis, STDs or HIV. There is no   history of blood transfusion. They have no travel history to infectious disease endemic areas of the world.  The patient has seen their dentist in the last six month. They have seen their eye doctor in the last year. They deny hearing difficulty with regard to whispered voices and some television programs.  They have deferred audiologic testing in the last year.  They do not  have excessive sun exposure. Discussed the need for sun protection: hats, long sleeves and use of sunscreen if there is significant sun exposure.   Diet: the importance of a healthy diet is discussed. They do have a healthy diet.  The benefits of regular aerobic exercise were discussed. She walks 4 times per week for 30 minutes.   Depression screen: there are no signs or vegative symptoms of depression- irritability, change in appetite, anhedonia, sadness/tearfullness.  The following portions of the patient's history were reviewed and updated as appropriate: allergies, current medications, past family history, past medical history,  past surgical history, past social  history  and problem list.  Visual acuity was not assessed per patient preference since she has regular follow up with her ophthalmologist. Hearing and body mass index were assessed and reviewed.   During the course of the visit the patient was educated and counseled about appropriate screening and preventive services including : fall prevention , diabetes screening, nutrition counseling, colorectal cancer screening, and recommended immunizations.    CC: The primary encounter diagnosis was Breast cancer screening. Diagnoses of Autoimmune hepatitis ([place]), Hypothyroidism due to acquired atrophy of thyroid, Hyperlipidemia LDL goal <160, and Visit for preventive health examination were also pertinent to this visit.   She was recently  rejected for long term care insurance  bc of autoimmune hepatitis.  History Krista Olson has a past medical history of Hepatitis, autoimmune ([place]) and Thyroid disease.   She has a past surgical history that includes Percutaneous liver biopsy.   Her family history includes Cancer (age of onset: 53) in her maternal aunt; Cancer (age of onset: 35) in her brother; Goiter in her mother; Heart disease in her brother; Heart disease (age of onset: 72) in her mother; Kidney disease in her father.She reports that she has never smoked. She has never used smokeless tobacco. She reports that she drinks about 3.0 oz of alcohol per week. She reports that she does not use drugs.  Outpatient Medications Prior to Visit  Medication Sig Dispense Refill  . atorvastatin (LIPITOR) 10 MG tablet TAKE 1 TABLET BY MOUTH ONCE DAILY NEED TO MAKE AN  APPOINTMENT 90 tablet 1  . azaTHIOprine (IMURAN) 50 MG tablet Take 1 tablet (50 mg total) by mouth daily. 30 tablet 5  . Calcium Carbonate-Vitamin D (CALCIUM + D PO) Take 1 tablet by mouth daily.    . fish oil-omega-3 fatty acids 1000 MG capsule Take 1 capsule by mouth daily.    Marland Kitchen levothyroxine (SYNTHROID, LEVOTHROID) 50 MCG tablet TAKE 1 TABLET BY MOUTH  ONCE DAILY ON AN EMPTY STOMACH. WAIT 30 MINUTES BEFORE TAKING OTHER MEDS. 90 tablet 1  . Multiple Vitamin (MULTIVITAMIN) tablet Take 1 tablet by mouth daily.    Marland Kitchen amoxicillin-clavulanate (AUGMENTIN) 875-125 MG tablet Take 1 tablet by mouth 2 (two) times daily. (Patient not taking: Reported on 05/30/2018) 14 tablet 0  . benzonatate (TESSALON) 200 MG capsule Take 1 capsule (200 mg total) by mouth 3 (three) times daily as needed for cough. (Patient not taking: Reported on 05/30/2018) 60 capsule 1  . predniSONE (DELTASONE) 10 MG tablet 6 tablets on Day 1 , then reduce by 1 tablet daily until gone (Patient not taking: Reported on 05/30/2018) 21 tablet 0   No facility-administered medications prior to visit.     Review of Systems   Patient denies headache, fevers, malaise, unintentional weight loss, skin rash, eye pain, sinus congestion and sinus pain, sore throat, dysphagia,  hemoptysis , cough, dyspnea, wheezing, chest pain, palpitations, orthopnea, edema, abdominal pain, nausea, melena, diarrhea, constipation, flank pain, dysuria, hematuria, urinary  Frequency, nocturia, numbness, tingling, seizures,  Focal weakness, Loss of consciousness,  Tremor, insomnia, depression, anxiety, and suicidal ideation.      Objective:  BP 120/84 (BP Location: Left Arm, Patient Position: Sitting, Cuff Size: Normal)   Pulse 74   Temp 98.2 F (36.8 C) (Oral)   Resp 14   Ht 5' 2.5" (1.588 m)   Wt 136 lb 12.8 oz (62.1 kg)   SpO2 96%   BMI 24.62 kg/m   Physical Exam   General appearance: alert, cooperative and appears stated age Head: Normocephalic, without obvious abnormality, atraumatic Eyes: conjunctivae/corneas clear. PERRL, EOM's intact. Fundi benign. Ears: normal TM's and external ear canals both ears Nose: Nares normal. Septum midline. Mucosa normal. No drainage or sinus tenderness. Throat: lips, mucosa, and tongue normal; teeth and gums normal Neck: no adenopathy, no carotid bruit, no JVD, supple,  symmetrical, trachea midline and thyroid not enlarged, symmetric, no tenderness/mass/nodules Lungs: clear to auscultation bilaterally Breasts: normal appearance, no masses or tenderness Heart: regular rate and rhythm, S1, S2 normal, no murmur, click, rub or gallop Abdomen: soft, non-tender; bowel sounds normal; no masses,  no organomegaly Extremities: extremities normal, atraumatic, no cyanosis or edema Pulses: 2+ and symmetric Skin: Skin color, texture, turgor normal. No rashes or lesions Neurologic: Alert and oriented X 3, normal strength and tone. Normal symmetric reflexes. Normal coordination and gait.      Assessment & Plan:   Problem List Items Addressed This Visit    Autoimmune hepatitis ([place])    Treated with Imuran x 3 + years.  Repeat liver enzymes are normal. .  Annual Follow up with Dr. Drue Novel has  been done in March ,. Hepatitis A&B vaccines up to date .    Lab Results  Component Value Date   ALT 21 05/30/2018   AST 23 05/30/2018   ALKPHOS 55 05/30/2018   BILITOT 0.5 05/30/2018               Relevant Orders   Comprehensive metabolic panel (Completed)   CBC with Differential/Platelet (Completed)  Hyperlipidemia LDL goal <160    Historically well controlled on current regimen of atorvastatin. . Liver enzymes are normal, no changes today.  Lab Results  Component Value Date   CHOL 172 04/06/2017   HDL 51.50 04/06/2017   LDLCALC 89 04/06/2017   LDLDIRECT 149.0 10/19/2017   TRIG 155.0 (H) 04/06/2017   CHOLHDL 3 04/06/2017   Lab Results  Component Value Date   ALT 21 05/30/2018   AST 23 05/30/2018   ALKPHOS 55 05/30/2018   BILITOT 0.5 05/30/2018               Hypothyroidism    Thyroid function is WNL on current dose.  No current changes needed.   Lab Results  Component Value Date   TSH 4.24 05/30/2018         Relevant Orders   TSH (Completed)   Visit for preventive health examination    Annual comprehensive preventive exam was done  as well as an evaluation and management of chronic conditions .  During the course of the visit the patient was educated and counseled about appropriate screening and preventive services including :  diabetes screening, lipid analysis with projected  10 year  risk for CAD , nutrition counseling, breast, cervical and colorectal cancer screening, and recommended immunizations.  Printed recommendations for health maintenance screenings was given       Other Visit Diagnoses    Breast cancer screening    -  Primary   Relevant Orders   MM 3D SCREEN BREAST BILATERAL      I have discontinued Merci M. Melfi's predniSONE, benzonatate, and amoxicillin-clavulanate. I am also having her maintain her Calcium Carbonate-Vitamin D (CALCIUM + D PO), multivitamin, fish oil-omega-3 fatty acids, azaTHIOprine, levothyroxine, and atorvastatin.  No orders of the defined types were placed in this encounter.   Medications Discontinued During This Encounter  Medication Reason  . amoxicillin-clavulanate (AUGMENTIN) 875-125 MG tablet Completed Course  . benzonatate (TESSALON) 200 MG capsule Completed Course  . predniSONE (DELTASONE) 10 MG tablet Completed Course    Follow-up: Return in about 6 months (around 11/30/2018).   Crecencio Mc, MD

## 2018-05-30 NOTE — Patient Instructions (Addendum)
Krista Olson are up to date on colon cancer and cervical cancer screening  Your mammogram was last June, and you are due for annual screening.  It has been ordered; you can schedule it at your convenience.  You can return here for your repeat Shingles  Vaccination   Health Maintenance for Postmenopausal Women Menopause is a normal process in which your reproductive ability comes to an end. This process happens gradually over a span of months to years, usually between the ages of 63 and 42. Menopause is complete when you have missed 12 consecutive menstrual periods. It is important to talk with your health care provider about some of the most common conditions that affect postmenopausal women, such as heart disease, cancer, and bone loss (osteoporosis). Adopting a healthy lifestyle and getting preventive care can help to promote your health and wellness. Those actions can also lower your chances of developing some of these common conditions. What should I know about menopause? During menopause, you may experience a number of symptoms, such as:  Moderate-to-severe hot flashes.  Night sweats.  Decrease in sex drive.  Mood swings.  Headaches.  Tiredness.  Irritability.  Memory problems.  Insomnia.  Choosing to treat or not to treat menopausal changes is an individual decision that you make with your health care provider. What should I know about hormone replacement therapy and supplements? Hormone therapy products are effective for treating symptoms that are associated with menopause, such as hot flashes and night sweats. Hormone replacement carries certain risks, especially as you become older. If you are thinking about using estrogen or estrogen with progestin treatments, discuss the benefits and risks with your health care provider. What should I know about heart disease and stroke? Heart disease, heart attack, and stroke become more likely as you age. This may be due, in part, to the  hormonal changes that your body experiences during menopause. These can affect how your body processes dietary fats, triglycerides, and cholesterol. Heart attack and stroke are both medical emergencies. There are many things that you can do to help prevent heart disease and stroke:  Have your blood pressure checked at least every 1-2 years. High blood pressure causes heart disease and increases the risk of stroke.  If you are 63-2 years old, ask your health care provider if you should take aspirin to prevent a heart attack or a stroke.  Do not use any tobacco products, including cigarettes, chewing tobacco, or electronic cigarettes. If you need help quitting, ask your health care provider.  It is important to eat a healthy diet and maintain a healthy weight. ? Be sure to include plenty of vegetables, fruits, low-fat dairy products, and lean protein. ? Avoid eating foods that are high in solid fats, added sugars, or salt (sodium).  Get regular exercise. This is one of the most important things that you can do for your health. ? Try to exercise for at least 150 minutes each week. The type of exercise that you do should increase your heart rate and make you sweat. This is known as moderate-intensity exercise. ? Try to do strengthening exercises at least twice each week. Do these in addition to the moderate-intensity exercise.  Know your numbers.Ask your health care provider to check your cholesterol and your blood glucose. Continue to have your blood tested as directed by your health care provider.  What should I know about cancer screening? There are several types of cancer. Take the following steps to reduce your risk and to catch  any cancer development as early as possible. Breast Cancer  Practice breast self-awareness. ? This means understanding how your breasts normally appear and feel. ? It also means doing regular breast self-exams. Let your health care provider know about any changes,  no matter how small.  If you are 63 or older, have a clinician do a breast exam (clinical breast exam or CBE) every year. Depending on your age, family history, and medical history, it may be recommended that you also have a yearly breast X-ray (mammogram).  If you have a family history of breast cancer, talk with your health care provider about genetic screening.  If you are at high risk for breast cancer, talk with your health care provider about having an MRI and a mammogram every year.  Breast cancer (BRCA) gene test is recommended for women who have family members with BRCA-related cancers. Results of the assessment will determine the need for genetic counseling and BRCA1 and for BRCA2 testing. BRCA-related cancers include these types: ? Breast. This occurs in males or females. ? Ovarian. ? Tubal. This may also be called fallopian tube cancer. ? Cancer of the abdominal or pelvic lining (peritoneal cancer). ? Prostate. ? Pancreatic.  Cervical, Uterine, and Ovarian Cancer Your health care provider may recommend that you be screened regularly for cancer of the pelvic organs. These include your ovaries, uterus, and vagina. This screening involves a pelvic exam, which includes checking for microscopic changes to the surface of your cervix (Pap test).  For women ages 21-65, health care providers may recommend a pelvic exam and a Pap test every three years. For women ages 18-65, they may recommend the Pap test and pelvic exam, combined with testing for human papilloma virus (HPV), every five years. Some types of HPV increase your risk of cervical cancer. Testing for HPV may also be done on women of any age who have unclear Pap test results.  Other health care providers may not recommend any screening for nonpregnant women who are considered low risk for pelvic cancer and have no symptoms. Ask your health care provider if a screening pelvic exam is right for you.  If you have had past treatment  for cervical cancer or a condition that could lead to cancer, you need Pap tests and screening for cancer for at least 20 years after your treatment. If Pap tests have been discontinued for you, your risk factors (such as having a new sexual partner) need to be reassessed to determine if you should start having screenings again. Some women have medical problems that increase the chance of getting cervical cancer. In these cases, your health care provider may recommend that you have screening and Pap tests more often.  If you have a family history of uterine cancer or ovarian cancer, talk with your health care provider about genetic screening.  If you have vaginal bleeding after reaching menopause, tell your health care provider.  There are currently no reliable tests available to screen for ovarian cancer.  Lung Cancer Lung cancer screening is recommended for adults 18-40 years old who are at high risk for lung cancer because of a history of smoking. A yearly low-dose CT scan of the lungs is recommended if you:  Currently smoke.  Have a history of at least 30 pack-years of smoking and you currently smoke or have quit within the past 15 years. A pack-year is smoking an average of one pack of cigarettes per day for one year.  Yearly screening should:  Continue  until it has been 15 years since you quit.  Stop if you develop a health problem that would prevent you from having lung cancer treatment.  Colorectal Cancer  This type of cancer can be detected and can often be prevented.  Routine colorectal cancer screening usually begins at age 32 and continues through age 69.  If you have risk factors for colon cancer, your health care provider may recommend that you be screened at an earlier age.  If you have a family history of colorectal cancer, talk with your health care provider about genetic screening.  Your health care provider may also recommend using home test kits to check for hidden  blood in your stool.  A small camera at the end of a tube can be used to examine your colon directly (sigmoidoscopy or colonoscopy). This is done to check for the earliest forms of colorectal cancer.  Direct examination of the colon should be repeated every 5-10 years until age 43. However, if early forms of precancerous polyps or small growths are found or if you have a family history or genetic risk for colorectal cancer, you may need to be screened more often.  Skin Cancer  Check your skin from head to toe regularly.  Monitor any moles. Be sure to tell your health care provider: ? About any new moles or changes in moles, especially if there is a change in a mole's shape or color. ? If you have a mole that is larger than the size of a pencil eraser.  If any of your family members has a history of skin cancer, especially at a young age, talk with your health care provider about genetic screening.  Always use sunscreen. Apply sunscreen liberally and repeatedly throughout the day.  Whenever you are outside, protect yourself by wearing long sleeves, pants, a wide-brimmed hat, and sunglasses.  What should I know about osteoporosis? Osteoporosis is a condition in which bone destruction happens more quickly than new bone creation. After menopause, you may be at an increased risk for osteoporosis. To help prevent osteoporosis or the bone fractures that can happen because of osteoporosis, the following is recommended:  If you are 102-70 years old, get at least 1,000 mg of calcium and at least 600 mg of vitamin D per day.  If you are older than age 78 but younger than age 60, get at least 1,200 mg of calcium and at least 600 mg of vitamin D per day.  If you are older than age 75, get at least 1,200 mg of calcium and at least 800 mg of vitamin D per day.  Smoking and excessive alcohol intake increase the risk of osteoporosis. Eat foods that are rich in calcium and vitamin D, and do weight-bearing  exercises several times each week as directed by your health care provider. What should I know about how menopause affects my mental health? Depression may occur at any age, but it is more common as you become older. Common symptoms of depression include:  Low or sad mood.  Changes in sleep patterns.  Changes in appetite or eating patterns.  Feeling an overall lack of motivation or enjoyment of activities that you previously enjoyed.  Frequent crying spells.  Talk with your health care provider if you think that you are experiencing depression. What should I know about immunizations? It is important that you get and maintain your immunizations. These include:  Tetanus, diphtheria, and pertussis (Tdap) booster vaccine.  Influenza every year before the flu  season begins.  Pneumonia vaccine.  Shingles vaccine.  Your health care provider may also recommend other immunizations. This information is not intended to replace advice given to you by your health care provider. Make sure you discuss any questions you have with your health care provider. Document Released: 01/01/2006 Document Revised: 05/29/2016 Document Reviewed: 08/13/2015 Elsevier Interactive Patient Education  2018 [employer].

## 2018-05-31 NOTE — Assessment & Plan Note (Signed)
Thyroid function is WNL on current dose.  No current changes needed.   Lab Results  Component Value Date   TSH 4.24 05/30/2018

## 2018-05-31 NOTE — Assessment & Plan Note (Signed)
Annual comprehensive preventive exam was done as well as an evaluation and management of chronic conditions .  During the course of the visit the patient was educated and counseled about appropriate screening and preventive services including :  diabetes screening, lipid analysis with projected  10 year  risk for CAD , nutrition counseling, breast, cervical and colorectal cancer screening, and recommended immunizations.  Printed recommendations for health maintenance screenings was given 

## 2018-05-31 NOTE — Assessment & Plan Note (Signed)
Historically well controlled on current regimen of atorvastatin. . Liver enzymes are normal, no changes today.  Lab Results  Component Value Date   CHOL 172 04/06/2017   HDL 51.50 04/06/2017   LDLCALC 89 04/06/2017   LDLDIRECT 149.0 10/19/2017   TRIG 155.0 (H) 04/06/2017   CHOLHDL 3 04/06/2017   Lab Results  Component Value Date   ALT 21 05/30/2018   AST 23 05/30/2018   ALKPHOS 55 05/30/2018   BILITOT 0.5 05/30/2018

## 2018-05-31 NOTE — Assessment & Plan Note (Signed)
Treated with Imuran x 3 + years.  Repeat liver enzymes are normal. .  Annual Follow up with Dr. Drue Novel has  been done in March ,. Hepatitis A&B vaccines up to date .    Lab Results  Component Value Date   ALT 21 05/30/2018   AST 23 05/30/2018   ALKPHOS 55 05/30/2018   BILITOT 0.5 05/30/2018

## 2018-06-21 ENCOUNTER — Ambulatory Visit
Admission: RE | Admit: 2018-06-21 | Discharge: 2018-06-21 | Disposition: A | Discharge status: Home / Self Care | Payer: Managed Care, Other (non HMO) | Source: Ambulatory Visit | Attending: Internal Medicine | Admitting: Internal Medicine

## 2018-06-21 DIAGNOSIS — Z1231 Encounter for screening mammogram for malignant neoplasm of breast: Secondary | ICD-10-CM | POA: Diagnosis present

## 2018-06-21 DIAGNOSIS — Z1239 Encounter for other screening for malignant neoplasm of breast: Secondary | ICD-10-CM

## 2018-07-28 ENCOUNTER — Other Ambulatory Visit: Payer: Self-pay | Admitting: Internal Medicine

## 2018-08-16 ENCOUNTER — Telehealth: Payer: Self-pay

## 2018-08-16 NOTE — Telephone Encounter (Signed)
Copied from [place] [id number]. Topic: General - Other >> Aug 16, 2018  4:24 PM Yvette Rack wrote: Reason for CRM: Pt called in asking if Dr Derrel Nip received any information or requests from [employer]. Pt requests a call back. Cb# [phone]

## 2018-08-17 NOTE — Telephone Encounter (Signed)
LMTCB. Please transfer pt to our office.  

## 2018-08-17 NOTE — Telephone Encounter (Signed)
I received a one page notification that did not request any information .  I no longer have it

## 2018-08-17 NOTE — Telephone Encounter (Signed)
I have not seen any thing, have you?

## 2018-08-22 NOTE — Telephone Encounter (Signed)
Spoke with pt to let her know that we only received a one page notification and that it did not request any information. Pt gave a verbal understanding.

## 2018-09-01 ENCOUNTER — Other Ambulatory Visit: Payer: Self-pay | Admitting: Internal Medicine

## 2018-10-28 ENCOUNTER — Other Ambulatory Visit: Payer: Self-pay | Admitting: Internal Medicine

## 2018-11-30 ENCOUNTER — Encounter: Payer: Self-pay | Admitting: Internal Medicine

## 2018-11-30 ENCOUNTER — Ambulatory Visit: Payer: Managed Care, Other (non HMO) | Admitting: Internal Medicine

## 2018-11-30 VITALS — BP 138/82 | HR 82 | Temp 98.0°F | Resp 15 | Ht 62.5 in | Wt 139.2 lb

## 2018-11-30 DIAGNOSIS — E785 Hyperlipidemia, unspecified: Secondary | ICD-10-CM

## 2018-11-30 DIAGNOSIS — E034 Atrophy of thyroid (acquired): Secondary | ICD-10-CM

## 2018-11-30 DIAGNOSIS — Z0000 Encounter for general adult medical examination without abnormal findings: Secondary | ICD-10-CM

## 2018-11-30 DIAGNOSIS — Z79899 Other long term (current) drug therapy: Secondary | ICD-10-CM | POA: Diagnosis not present

## 2018-11-30 DIAGNOSIS — J310 Chronic rhinitis: Secondary | ICD-10-CM

## 2018-11-30 DIAGNOSIS — K754 Autoimmune hepatitis: Secondary | ICD-10-CM

## 2018-11-30 DIAGNOSIS — Z23 Encounter for immunization: Secondary | ICD-10-CM | POA: Diagnosis not present

## 2018-11-30 LAB — LIPID PANEL
CHOL/HDL RATIO: 4
CHOLESTEROL: 212 mg/dL — AB (ref 0–200)
HDL: 57.3 mg/dL (ref >39.00)
NonHDL: 154.3
TRIGLYCERIDES: 202 mg/dL — AB (ref 0.0–149.0)
VLDL: 40.4 mg/dL — AB (ref 0.0–40.0)

## 2018-11-30 LAB — CBC WITH DIFFERENTIAL/PLATELET
BASOS PCT: 0.9 % (ref 0.0–3.0)
Basophils Absolute: 0.1 10*3/uL (ref 0.0–0.1)
EOS ABS: 0.1 10*3/uL (ref 0.0–0.7)
EOS PCT: 1.7 % (ref 0.0–5.0)
HEMATOCRIT: 42.2 % (ref 36.0–46.0)
HEMOGLOBIN: 14.1 g/dL (ref 12.0–15.0)
Lymphocytes Relative: 31.5 % (ref 12.0–46.0)
Lymphs Abs: 1.8 10*3/uL (ref 0.7–4.0)
MCHC: 33.4 g/dL (ref 30.0–36.0)
MCV: 96 fl (ref 78.0–100.0)
MONO ABS: 0.6 10*3/uL (ref 0.1–1.0)
Monocytes Relative: 9.7 % (ref 3.0–12.0)
NEUTROS ABS: 3.3 10*3/uL (ref 1.4–7.7)
Neutrophils Relative %: 56.2 % (ref 43.0–77.0)
Platelets: 268 10*3/uL (ref 150.0–400.0)
RBC: 4.4 Mil/uL (ref 3.87–5.11)
RDW: 13.8 % (ref 11.5–15.5)
WBC: 5.8 10*3/uL (ref 4.0–10.5)

## 2018-11-30 LAB — COMPREHENSIVE METABOLIC PANEL
ALBUMIN: 4.6 g/dL (ref 3.5–5.2)
ALK PHOS: 56 U/L (ref 39–117)
ALT: 16 U/L (ref 0–35)
AST: 21 U/L (ref 0–37)
BUN: 16 mg/dL (ref 6–23)
CALCIUM: 10.4 mg/dL (ref 8.4–10.5)
CHLORIDE: 103 meq/L (ref 96–112)
CO2: 29 mEq/L (ref 19–32)
Creatinine, Ser: 0.69 mg/dL (ref 0.40–1.20)
GFR: 91.3 mL/min (ref >60.00)
Glucose, Bld: 93 mg/dL (ref 70–99)
POTASSIUM: 4.4 meq/L (ref 3.5–5.1)
Sodium: 139 mEq/L (ref 135–145)
TOTAL PROTEIN: 7.8 g/dL (ref 6.0–8.3)
Total Bilirubin: 0.5 mg/dL (ref 0.2–1.2)

## 2018-11-30 LAB — TSH: TSH: 5.69 u[IU]/mL — ABNORMAL HIGH (ref 0.35–4.50)

## 2018-11-30 LAB — LDL CHOLESTEROL, DIRECT: Direct LDL: 127 mg/dL

## 2018-11-30 MED ORDER — AZELASTINE-FLUTICASONE 137-50 MCG/ACT NA SUSP: NASAL | 1 refills | Status: DC

## 2018-11-30 NOTE — Progress Notes (Signed)
Subjective:  Patient ID: Krista Olson, female    DOB: Oct 19, 1956  Age: 63 y.o. MRN: [id number]  CC: The primary encounter diagnosis was Hyperlipidemia LDL goal <160. Diagnoses of Hypothyroidism due to acquired atrophy of thyroid, Long-term use of high-risk medication, Need for shingles vaccine, Autoimmune hepatitis ([place]), and Visit for preventive health examination were also pertinent to this visit.  HPI Krista Olson presents for 6 month follow up on hyperlipidemia ,autoimmune hepatitis manged with Imuran , hypothyroid   Hepatitis managed by [facility]  With Imuran,  No flares.  Due for labs   Shingrx vaccine given in August,  Missed the 6 month window,  Wants to resume.  advised for patient.    Sinus drainage for months.  Not responding to claritin .  Outpatient Medications Prior to Visit  Medication Sig Dispense Refill  . atorvastatin (LIPITOR) 10 MG tablet TAKE 1 TABLET BY MOUTH AT BEDTIME 90 tablet 1  . azaTHIOprine (IMURAN) 50 MG tablet Take 1 tablet (50 mg total) by mouth daily. 30 tablet 5  . Calcium Carbonate-Vitamin D (CALCIUM + D PO) Take 1 tablet by mouth daily.    . fish oil-omega-3 fatty acids 1000 MG capsule Take 1 capsule by mouth daily.    Marland Kitchen levothyroxine (SYNTHROID, LEVOTHROID) 50 MCG tablet TAKE 1 TABLET BY MOUTH ONCE DAILY ON AN EMPTY STOMACH. WAIT 30 MINUTES BEFORE TAKING OTHER MEDS. 90 tablet 1  . Multiple Vitamin (MULTIVITAMIN) tablet Take 1 tablet by mouth daily.     No facility-administered medications prior to visit.     Review of Systems;  Patient denies headache, fevers, malaise, unintentional weight loss, skin rash, eye pain, sinus congestion and sinus pain, sore throat, dysphagia,  hemoptysis , cough, dyspnea, wheezing, chest pain, palpitations, orthopnea, edema, abdominal pain, nausea, melena, diarrhea, constipation, flank pain, dysuria, hematuria, urinary  Frequency, nocturia, numbness, tingling, seizures,  Focal weakness, Loss of consciousness,  Tremor,  insomnia, depression, anxiety, and suicidal ideation.      Objective:  BP 138/82 (BP Location: Left Arm, Patient Position: Sitting, Cuff Size: Normal)   Pulse 82   Temp 98 F (36.7 C) (Oral)   Resp 15   Ht 5' 2.5" (1.588 m)   Wt 139 lb 3.2 oz (63.1 kg)   SpO2 95%   BMI 25.05 kg/m   BP Readings from Last 3 Encounters:  11/30/18 138/82  05/30/18 120/84  10/19/17 122/76    Wt Readings from Last 3 Encounters:  11/30/18 139 lb 3.2 oz (63.1 kg)  05/30/18 136 lb 12.8 oz (62.1 kg)  10/19/17 135 lb 9.6 oz (61.5 kg)    General appearance: alert, cooperative and appears stated age Ears: normal TM's and external ear canals both ears Throat: lips, mucosa, and tongue normal; teeth and gums normal Neck: no adenopathy, no carotid bruit, supple, symmetrical, trachea midline and thyroid not enlarged, symmetric, no tenderness/mass/nodules Back: symmetric, no curvature. ROM normal. No CVA tenderness. Lungs: clear to auscultation bilaterally Heart: regular rate and rhythm, S1, S2 normal, no murmur, click, rub or gallop Abdomen: soft, non-tender; bowel sounds normal; no masses,  no organomegaly Pulses: 2+ and symmetric Skin: Skin color, texture, turgor normal. No rashes or lesions Lymph nodes: Cervical, supraclavicular, and axillary nodes normal.  No results found for: HGBA1C  Lab Results  Component Value Date   CREATININE 0.69 11/30/2018   CREATININE 0.73 05/30/2018   CREATININE 0.71 10/19/2017    Lab Results  Component Value Date   WBC 5.8 11/30/2018   HGB  14.1 11/30/2018   HCT 42.2 11/30/2018   PLT 268.0 11/30/2018   GLUCOSE 93 11/30/2018   CHOL 212 (H) 11/30/2018   TRIG 202.0 (H) 11/30/2018   HDL 57.30 11/30/2018   LDLDIRECT 127.0 11/30/2018   LDLCALC 89 04/06/2017   ALT 16 11/30/2018   AST 21 11/30/2018   NA 139 11/30/2018   K 4.4 11/30/2018   CL 103 11/30/2018   CREATININE 0.69 11/30/2018   BUN 16 11/30/2018   CO2 29 11/30/2018   TSH 5.69 (H) 11/30/2018    Mm  3d Screen Breast Bilateral  Result Date: 06/21/2018 CLINICAL DATA:  Screening. EXAM: DIGITAL SCREENING BILATERAL MAMMOGRAM WITH TOMO AND CAD COMPARISON:  Previous exam(s). ACR Breast Density Category b: There are scattered areas of fibroglandular density. FINDINGS: There are no findings suspicious for malignancy. Images were processed with CAD. IMPRESSION: No mammographic evidence of malignancy. A result letter of this screening mammogram will be mailed directly to the patient. RECOMMENDATION: Screening mammogram in one year. (Code:SM-B-01Y) BI-RADS CATEGORY  1: Negative. Electronically Signed   By: Everlean Alstrom M.D.   On: 06/21/2018 13:27    Assessment & Plan:   Problem List Items Addressed This Visit    Autoimmune hepatitis ([place])    Treated with Imuran x 3 + years.  Repeat liver enzymes are normal. .  Annual Follow up with Dr. Drue Novel has  been done in March ,. Hepatitis A&B vaccines up to date .    Lab Results  Component Value Date   ALT 16 11/30/2018   AST 21 11/30/2018   ALKPHOS 56 11/30/2018   BILITOT 0.5 11/30/2018               Hyperlipidemia LDL goal <160 - Primary    LDL and triglycerides are at goal on current medications. sHe has no side effects and liver enzymes are normal. No changes today  Lab Results  Component Value Date   CHOL 212 (H) 11/30/2018   HDL 57.30 11/30/2018   LDLCALC 89 04/06/2017   LDLDIRECT 127.0 11/30/2018   TRIG 202.0 (H) 11/30/2018   CHOLHDL 4 11/30/2018   Lab Results  Component Value Date   ALT 16 11/30/2018   AST 21 11/30/2018   ALKPHOS 56 11/30/2018   BILITOT 0.5 11/30/2018          Relevant Orders   Lipid panel (Completed)   Hypothyroidism    Thyroid was underactive on current synthroid dose. Increase to 75 mcg daily and recheck in 3 months   Lab Results  Component Value Date   TSH 5.69 (H) 11/30/2018         Relevant Orders   TSH (Completed)   Visit for preventive health examination    Other Visit Diagnoses      Long-term use of high-risk medication       Relevant Orders   Comprehensive metabolic panel (Completed)   CBC with Differential/Platelet (Completed)   Need for shingles vaccine       Relevant Orders   Varicella-zoster vaccine IM (Shingrix) (Completed)    A total of 25 minutes of face to face time was spent with patient more than half of which was spent in counselling about the above mentioned conditions  and coordination of care   I am having Krista Olson. Krista Olson start on Azelastine-Fluticasone. I am also having her maintain her Calcium Carbonate-Vitamin D (CALCIUM + D PO), multivitamin, fish oil-omega-3 fatty acids, azaTHIOprine, atorvastatin, and levothyroxine.  Meds ordered this encounter  Medications  .  Azelastine-Fluticasone (DYMISTA) 137-50 MCG/ACT SUSP    Sig: 1 shot twice daily in each nostril    Dispense:  23 g    Refill:  1    There are no discontinued medications.  Follow-up: Return in about 6 months (around 05/31/2019) for CPE.   Crecencio Mc, MD

## 2018-11-30 NOTE — Patient Instructions (Signed)
I am prescribing Dymista,  A nasal spray ,, for your nasal symptoms  If you see no improvement after one week. Let me know and I will prescribed a prednisone taper and an antibiotic  Your 2nd Shingrx vaccine is due on or after February 8 and before May 31 2019  You can try using aleve  1-2 tablets every 12 hours for a few days to manage your back muscle strain.  This can be combined with tylenol if needed for pain control (You can add up to 2000 mg of acetominophen (tylenol) every day safely  In divided doses (500 mg every 6 hours  Or 1000 mg every 12 hours.)

## 2018-12-01 ENCOUNTER — Encounter: Payer: Self-pay | Admitting: Internal Medicine

## 2018-12-01 ENCOUNTER — Telehealth: Payer: Self-pay | Admitting: Internal Medicine

## 2018-12-01 ENCOUNTER — Other Ambulatory Visit: Payer: Self-pay | Admitting: Internal Medicine

## 2018-12-01 DIAGNOSIS — J310 Chronic rhinitis: Secondary | ICD-10-CM | POA: Insufficient documentation

## 2018-12-01 NOTE — Assessment & Plan Note (Signed)
Thyroid was underactive on current synthroid dose. Increase to 75 mcg daily and recheck in 3 months   Lab Results  Component Value Date   TSH 5.69 (H) 11/30/2018

## 2018-12-01 NOTE — Assessment & Plan Note (Signed)
etiology unclear.  Not responding to use of claritin .  Trial of Dymista

## 2018-12-01 NOTE — Assessment & Plan Note (Signed)
Treated with Imuran x 3 + years.  Repeat liver enzymes are normal. .  Annual Follow up with Dr. Drue Novel has  been done in March ,. Hepatitis A&B vaccines up to date .    Lab Results  Component Value Date   ALT 16 11/30/2018   AST 21 11/30/2018   ALKPHOS 56 11/30/2018   BILITOT 0.5 11/30/2018

## 2018-12-01 NOTE — Assessment & Plan Note (Signed)
LDL and triglycerides are at goal on current medications. sHe has no side effects and liver enzymes are normal. No changes today  Lab Results  Component Value Date   CHOL 212 (H) 11/30/2018   HDL 57.30 11/30/2018   LDLCALC 89 04/06/2017   LDLDIRECT 127.0 11/30/2018   TRIG 202.0 (H) 11/30/2018   CHOLHDL 4 11/30/2018   Lab Results  Component Value Date   ALT 16 11/30/2018   AST 21 11/30/2018   ALKPHOS 56 11/30/2018   BILITOT 0.5 11/30/2018

## 2018-12-01 NOTE — Telephone Encounter (Signed)
Copied from [place] [id number]. Topic: Quick Communication - See Telephone Encounter >> Dec 01, 2018  1:18 PM Ivar Drape wrote: CRM for notification. See Telephone encounter for: 12/01/18. Patient was in to see the provider yesterday, 11/30/2018 and was told to call the provider when she was ready for the Prednisone prescription medication.  She would like to have that called into her preferred pharmacy [facility] in [place], [place].

## 2018-12-01 NOTE — Assessment & Plan Note (Deleted)

## 2018-12-01 NOTE — Telephone Encounter (Signed)
  Patient states was in to see the provider yesterday, 11/30/2018 and was told to call the provider when she was ready for  Prednisone prescription.  She would like to have that called in to [facility] in [place].

## 2018-12-02 MED ORDER — PREDNISONE 10 MG PO TABS: ORAL_TABLET | ORAL | 0 refills | Status: DC

## 2018-12-02 NOTE — Telephone Encounter (Signed)
DONE

## 2019-01-12 ENCOUNTER — Telehealth: Payer: Self-pay | Admitting: Internal Medicine

## 2019-01-12 DIAGNOSIS — K754 Autoimmune hepatitis: Secondary | ICD-10-CM

## 2019-01-12 DIAGNOSIS — E038 Other specified hypothyroidism: Secondary | ICD-10-CM

## 2019-01-12 NOTE — Telephone Encounter (Signed)
Copied from [place] [id number]. Topic: Quick Communication - See Telephone Encounter >> Jan 12, 2019  2:39 PM Krista Olson wrote: CRM for notification. See Telephone encounter for: 01/12/19.  Pt called in to schedule her 2nd shingles injection and also have some liver function labs completed. Is this okay for scheduling?  Not showing any orders, Please assist.

## 2019-01-13 NOTE — Telephone Encounter (Signed)
Patient is wanting to have liver function checked with thyroid function. She sees Dr. Drue Novel at [facility] that like to check her liver function every 3-4 months. Advised that Dr Derrel Nip and her CMA are out of the office this week and will have to have approval from Dr. Derrel Nip to add on labs.

## 2019-01-16 NOTE — Telephone Encounter (Signed)
Patient is aware that labs have been ordered and also scheduled for labs and second shingrix vaccine

## 2019-02-10 ENCOUNTER — Other Ambulatory Visit: Payer: Self-pay | Admitting: Internal Medicine

## 2019-02-10 DIAGNOSIS — Z79899 Other long term (current) drug therapy: Secondary | ICD-10-CM

## 2019-02-17 ENCOUNTER — Telehealth: Payer: Self-pay | Admitting: Internal Medicine

## 2019-02-17 NOTE — Telephone Encounter (Signed)
Returned call and spoke to pt.  Pt stated that she has been experiencing nasal congestion for the past several months including sinus pressure around nose and under eyes.  No fever. No cough. No shortness of breath.  Patient denies travel. Pt believes this be allergies. Pt has been using generic brand Afrin spray.  Pt says that Claritin really doesn't help.  Pt is open to televisit.  Pt says that she has an appt for second dosage of Shingrix and labs on 02/22/19.  Pt will wait for call next week to be advised.

## 2019-02-17 NOTE — Telephone Encounter (Unsigned)
Copied from [place] [id number]. Topic: General - Other >> Feb 17, 2019  4:25 PM Celene Kras A wrote: Reason for CRM: Pt left vm stating she is still very congested and has been this way for several months and is requesting next steps from Dr. Derrel Nip. Pt states she has taken Claritin and generic sudafed, but neither seems to help. Pt is requesting either a prescription be sent in to help or an OTC that would be more effective. PT states Dr. Derrel Nip mentioned she may be experiencing a mild virus. Please advise.  [place], [place] [address] Phone: [phone] Fax: [phone] Not a 24 hour pharmacy; exact hours not known.

## 2019-02-20 DIAGNOSIS — K754 Autoimmune hepatitis: Secondary | ICD-10-CM

## 2019-02-20 DIAGNOSIS — E034 Atrophy of thyroid (acquired): Secondary | ICD-10-CM

## 2019-02-20 NOTE — Telephone Encounter (Signed)
Spoke with pt and she stated that she went to the drug store on Friday and got some different OTC medications. Pt stated that since taking those she is starting to feel better. Pt stated that she will hold off on the office visit for now since she is starting to feel better.

## 2019-02-22 ENCOUNTER — Other Ambulatory Visit (INDEPENDENT_AMBULATORY_CARE_PROVIDER_SITE_OTHER): Payer: Managed Care, Other (non HMO)

## 2019-02-22 ENCOUNTER — Ambulatory Visit (INDEPENDENT_AMBULATORY_CARE_PROVIDER_SITE_OTHER): Payer: Managed Care, Other (non HMO)

## 2019-02-22 ENCOUNTER — Other Ambulatory Visit: Payer: Self-pay

## 2019-02-22 DIAGNOSIS — K754 Autoimmune hepatitis: Secondary | ICD-10-CM

## 2019-02-22 DIAGNOSIS — E034 Atrophy of thyroid (acquired): Secondary | ICD-10-CM | POA: Diagnosis not present

## 2019-02-22 DIAGNOSIS — Z23 Encounter for immunization: Secondary | ICD-10-CM

## 2019-02-22 DIAGNOSIS — Z79899 Other long term (current) drug therapy: Secondary | ICD-10-CM | POA: Diagnosis not present

## 2019-02-22 LAB — HEPATIC FUNCTION PANEL
ALT: 55 U/L — ABNORMAL HIGH (ref 0–35)
AST: 43 U/L — ABNORMAL HIGH (ref 0–37)
Albumin: 4.8 g/dL (ref 3.5–5.2)
Alkaline Phosphatase: 80 U/L (ref 39–117)
Bilirubin, Direct: 0.1 mg/dL (ref 0.0–0.3)
Total Bilirubin: 0.6 mg/dL (ref 0.2–1.2)
Total Protein: 7.9 g/dL (ref 6.0–8.3)

## 2019-02-22 LAB — COMPREHENSIVE METABOLIC PANEL
ALT: 55 U/L — ABNORMAL HIGH (ref 0–35)
AST: 43 U/L — ABNORMAL HIGH (ref 0–37)
Albumin: 4.8 g/dL (ref 3.5–5.2)
Alkaline Phosphatase: 80 U/L (ref 39–117)
BUN: 15 mg/dL (ref 6–23)
CO2: 27 mEq/L (ref 19–32)
Calcium: 10.2 mg/dL (ref 8.4–10.5)
Chloride: 103 mEq/L (ref 96–112)
Creatinine, Ser: 0.73 mg/dL (ref 0.40–1.20)
GFR: 80.43 mL/min (ref >60.00)
Glucose, Bld: 103 mg/dL — ABNORMAL HIGH (ref 70–99)
Potassium: 4.7 mEq/L (ref 3.5–5.1)
Sodium: 140 mEq/L (ref 135–145)
Total Bilirubin: 0.6 mg/dL (ref 0.2–1.2)
Total Protein: 7.9 g/dL (ref 6.0–8.3)

## 2019-02-22 LAB — CBC WITH DIFFERENTIAL/PLATELET
Basophils Absolute: 0.1 10*3/uL (ref 0.0–0.1)
Basophils Relative: 1.3 % (ref 0.0–3.0)
Eosinophils Absolute: 0.2 10*3/uL (ref 0.0–0.7)
Eosinophils Relative: 3.2 % (ref 0.0–5.0)
HCT: 42.7 % (ref 36.0–46.0)
Hemoglobin: 14.5 g/dL (ref 12.0–15.0)
Lymphocytes Relative: 29.6 % (ref 12.0–46.0)
Lymphs Abs: 1.6 10*3/uL (ref 0.7–4.0)
MCHC: 34 g/dL (ref 30.0–36.0)
MCV: 94.8 fl (ref 78.0–100.0)
Monocytes Absolute: 0.6 10*3/uL (ref 0.1–1.0)
Monocytes Relative: 11 % (ref 3.0–12.0)
Neutro Abs: 3 10*3/uL (ref 1.4–7.7)
Neutrophils Relative %: 54.9 % (ref 43.0–77.0)
Platelets: 276 10*3/uL (ref 150.0–400.0)
RBC: 4.5 Mil/uL (ref 3.87–5.11)
RDW: 14 % (ref 11.5–15.5)
WBC: 5.6 10*3/uL (ref 4.0–10.5)

## 2019-02-22 LAB — TSH: TSH: 5.17 u[IU]/mL — ABNORMAL HIGH (ref 0.35–4.50)

## 2019-02-22 NOTE — Progress Notes (Signed)
Krista Olson presents today for injection per MD orders. shinrix injection administered IM in left Upper Arm. Administration without incident. Patient tolerated well.  Nina,cma

## 2019-02-23 ENCOUNTER — Other Ambulatory Visit: Payer: Self-pay | Admitting: Internal Medicine

## 2019-02-23 LAB — AFP TUMOR MARKER: AFP-Tumor Marker: 4.7 ng/mL

## 2019-02-23 MED ORDER — LEVOTHYROXINE SODIUM 75 MCG PO TABS: 75.0000 ug | ORAL_TABLET | Freq: Every day | ORAL | 1 refills | Status: DC

## 2019-05-15 ENCOUNTER — Telehealth: Payer: Self-pay

## 2019-05-15 DIAGNOSIS — Z0000 Encounter for general adult medical examination without abnormal findings: Secondary | ICD-10-CM

## 2019-05-15 NOTE — Telephone Encounter (Signed)
Copied from [place] [id number]. Topic: General - Other >> May 15, 2019 11:15 AM Jodie Echevaria wrote: Reason for CRM: Patient called she have an appointment scheduled for 05/31/2019 and need to know if she can or need to do labs before the appointment and if so states that she need a Liver panel also. Ph#  [phone]

## 2019-05-16 ENCOUNTER — Other Ambulatory Visit: Payer: Self-pay | Admitting: Internal Medicine

## 2019-05-16 NOTE — Telephone Encounter (Signed)
I have ordered CBC, Lipid, A1c, TSH, and CMP for pt's physical that is on 05/31/2019. Is there anything else that needs to be ordered?

## 2019-05-31 ENCOUNTER — Other Ambulatory Visit: Payer: Self-pay

## 2019-05-31 ENCOUNTER — Encounter: Payer: Managed Care, Other (non HMO) | Admitting: Internal Medicine

## 2019-05-31 ENCOUNTER — Other Ambulatory Visit (INDEPENDENT_AMBULATORY_CARE_PROVIDER_SITE_OTHER): Payer: Managed Care, Other (non HMO)

## 2019-05-31 DIAGNOSIS — Z0000 Encounter for general adult medical examination without abnormal findings: Secondary | ICD-10-CM

## 2019-05-31 LAB — CBC WITH DIFFERENTIAL/PLATELET
Basophils Absolute: 0 10*3/uL (ref 0.0–0.1)
Basophils Relative: 0.7 % (ref 0.0–3.0)
Eosinophils Absolute: 0.1 10*3/uL (ref 0.0–0.7)
Eosinophils Relative: 2.3 % (ref 0.0–5.0)
HCT: 42 % (ref 36.0–46.0)
Hemoglobin: 14 g/dL (ref 12.0–15.0)
Lymphocytes Relative: 33.9 % (ref 12.0–46.0)
Lymphs Abs: 1.8 10*3/uL (ref 0.7–4.0)
MCHC: 33.4 g/dL (ref 30.0–36.0)
MCV: 94.9 fl (ref 78.0–100.0)
Monocytes Absolute: 0.5 10*3/uL (ref 0.1–1.0)
Monocytes Relative: 9.1 % (ref 3.0–12.0)
Neutro Abs: 2.9 10*3/uL (ref 1.4–7.7)
Neutrophils Relative %: 54 % (ref 43.0–77.0)
Platelets: 259 10*3/uL (ref 150.0–400.0)
RBC: 4.42 Mil/uL (ref 3.87–5.11)
RDW: 13.7 % (ref 11.5–15.5)
WBC: 5.3 10*3/uL (ref 4.0–10.5)

## 2019-05-31 LAB — COMPREHENSIVE METABOLIC PANEL
ALT: 17 U/L (ref 0–35)
AST: 20 U/L (ref 0–37)
Albumin: 4.5 g/dL (ref 3.5–5.2)
Alkaline Phosphatase: 64 U/L (ref 39–117)
BUN: 16 mg/dL (ref 6–23)
CO2: 27 mEq/L (ref 19–32)
Calcium: 9.7 mg/dL (ref 8.4–10.5)
Chloride: 104 mEq/L (ref 96–112)
Creatinine, Ser: 0.69 mg/dL (ref 0.40–1.20)
GFR: 85.77 mL/min (ref >60.00)
Glucose, Bld: 105 mg/dL — ABNORMAL HIGH (ref 70–99)
Potassium: 4.2 mEq/L (ref 3.5–5.1)
Sodium: 139 mEq/L (ref 135–145)
Total Bilirubin: 0.7 mg/dL (ref 0.2–1.2)
Total Protein: 7.7 g/dL (ref 6.0–8.3)

## 2019-05-31 LAB — LIPID PANEL
Cholesterol: 203 mg/dL — ABNORMAL HIGH (ref 0–200)
HDL: 55.8 mg/dL (ref >39.00)
LDL Cholesterol: 122 mg/dL — ABNORMAL HIGH (ref 0–99)
NonHDL: 147.3
Total CHOL/HDL Ratio: 4
Triglycerides: 129 mg/dL (ref 0.0–149.0)
VLDL: 25.8 mg/dL (ref 0.0–40.0)

## 2019-05-31 LAB — TSH: TSH: 0.5 u[IU]/mL (ref 0.35–4.50)

## 2019-05-31 LAB — HEMOGLOBIN A1C: Hgb A1c MFr Bld: 6.1 % (ref 4.6–6.5)

## 2019-06-14 ENCOUNTER — Other Ambulatory Visit: Payer: Self-pay | Admitting: Internal Medicine

## 2019-06-14 DIAGNOSIS — Z1231 Encounter for screening mammogram for malignant neoplasm of breast: Secondary | ICD-10-CM

## 2019-06-22 ENCOUNTER — Other Ambulatory Visit: Payer: Self-pay

## 2019-06-26 ENCOUNTER — Ambulatory Visit (INDEPENDENT_AMBULATORY_CARE_PROVIDER_SITE_OTHER): Payer: Managed Care, Other (non HMO) | Admitting: Internal Medicine

## 2019-06-26 ENCOUNTER — Other Ambulatory Visit: Payer: Self-pay

## 2019-06-26 ENCOUNTER — Encounter: Payer: Self-pay | Admitting: Internal Medicine

## 2019-06-26 VITALS — BP 110/72 | HR 67 | Temp 98.1°F | Resp 15 | Ht 62.5 in | Wt 136.8 lb

## 2019-06-26 DIAGNOSIS — K754 Autoimmune hepatitis: Secondary | ICD-10-CM

## 2019-06-26 DIAGNOSIS — Z79899 Other long term (current) drug therapy: Secondary | ICD-10-CM | POA: Diagnosis not present

## 2019-06-26 DIAGNOSIS — H9193 Unspecified hearing loss, bilateral: Secondary | ICD-10-CM | POA: Diagnosis not present

## 2019-06-26 NOTE — Progress Notes (Signed)
Patient ID: Krista Olson, female    DOB: 02/03/1956  Age: 63 y.o. MRN: [id number]  The patient is here for annual PREVENTIVE examination and management of other chronic and acute problems.   MAMMOGRAM SCHEDULED PAP SMEAR DUE 2021  COLONOSCOPY 2008.  cologuard negative 2018    The risk factors are reflected in the social history.  The roster of all physicians providing medical care to patient - is listed in the Snapshot section of the chart.  Activities of daily living:  The patient is 100% independent in all ADLs: dressing, toileting, feeding as well as independent mobility  Home safety : The patient has smoke detectors in the home. They wear seatbelts.  There are no firearms at home. There is no violence in the home.   There is no risks for hepatitis, STDs or HIV. There is no   history of blood transfusion. They have no travel history to infectious disease endemic areas of the world.  The patient has seen their dentist in the last six month. They have seen their eye doctor in the last year. They admit to slight hearing difficulty with regard to whispered voices and some television programs.  They have deferred audiologic testing in the last year.  They do not  have excessive sun exposure. Discussed the need for sun protection: hats, long sleeves and use of sunscreen if there is significant sun exposure.   Diet: the importance of a healthy diet is discussed. They do have a healthy diet.  The benefits of regular aerobic exercise were discussed. She walks 4 times per week ,  20 minutes.   Depression screen: there are no signs or vegative symptoms of depression- irritability, change in appetite, anhedonia, sadness/tearfullness.  Cognitive assessment: the patient manages all their financial and personal affairs and is actively engaged. They could relate day,date,year and events; recalled 2/3 objects at 3 minutes; performed clock-face test normally.  The following portions of the patient's  history were reviewed and updated as appropriate: allergies, current medications, past family history, past medical history,  past surgical history, past social history  and problem list.  Visual acuity was not assessed per patient preference since she has regular follow up with her ophthalmologist. Hearing and body mass index were assessed and reviewed.   During the course of the visit the patient was educated and counseled about appropriate screening and preventive services including : fall prevention , diabetes screening, nutrition counseling, colorectal cancer screening, and recommended immunizations.    CC: There were no encounter diagnoses.  History Krista Olson has a past medical history of Hepatitis, autoimmune ([place]) and Thyroid disease.   She has a past surgical history that includes Percutaneous liver biopsy.   Her family history includes Breast cancer in her maternal aunt; Cancer (age of onset: 48) in her maternal aunt; Cancer (age of onset: 65) in her brother; Goiter in her mother; Heart disease in her brother; Heart disease (age of onset: 34) in her mother; Kidney disease in her father.She reports that she has never smoked. She has never used smokeless tobacco. She reports current alcohol use of about 5.0 standard drinks of alcohol per week. She reports that she does not use drugs.  Outpatient Medications Prior to Visit  Medication Sig Dispense Refill  . atorvastatin (LIPITOR) 10 MG tablet TAKE 1 TABLET BY MOUTH AT BEDTIME 90 tablet 1  . azaTHIOprine (IMURAN) 50 MG tablet Take 1 tablet (50 mg total) by mouth daily. 30 tablet 5  . Azelastine-Fluticasone (DYMISTA) 137-50  MCG/ACT SUSP 1 shot twice daily in each nostril 23 g 1  . Calcium Carbonate-Vitamin D (CALCIUM + D PO) Take 1 tablet by mouth daily.    . fish oil-omega-3 fatty acids 1000 MG capsule Take 1 capsule by mouth daily.    Marland Kitchen levothyroxine (SYNTHROID, LEVOTHROID) 75 MCG tablet Take 1 tablet (75 mcg total) by mouth daily before  breakfast. 90 tablet 1  . Multiple Vitamin (MULTIVITAMIN) tablet Take 1 tablet by mouth daily.    . predniSONE (DELTASONE) 10 MG tablet 6 tablets on Day 1 , then reduce by 1 tablet daily until gone (Patient not taking: Reported on 06/26/2019) 21 tablet 0   No facility-administered medications prior to visit.     Review of Systems  Objective:  BP 110/72 (BP Location: Left Arm, Patient Position: Sitting, Cuff Size: Normal)   Pulse 67   Temp 98.1 F (36.7 C) (Oral)   Resp 15   Ht 5' 2.5" (1.588 m)   Wt 136 lb 12.8 oz (62.1 kg)   SpO2 98%   BMI 24.62 kg/m   Physical Exam    Assessment & Plan:   Problem List Items Addressed This Visit    None      I have discontinued Krista Olson's predniSONE. I am also having her maintain her Calcium Carbonate-Vitamin D (CALCIUM + D PO), multivitamin, fish oil-omega-3 fatty acids, azaTHIOprine, Azelastine-Fluticasone, levothyroxine, and atorvastatin.  No orders of the defined types were placed in this encounter.   Medications Discontinued During This Encounter  Medication Reason  . predniSONE (DELTASONE) 10 MG tablet Error    Follow-up: No follow-ups on file.   Krista Mc, MD

## 2019-06-26 NOTE — Patient Instructions (Signed)

## 2019-06-30 ENCOUNTER — Other Ambulatory Visit: Payer: Self-pay | Admitting: Internal Medicine

## 2019-07-18 ENCOUNTER — Ambulatory Visit
Admission: RE | Admit: 2019-07-18 | Discharge: 2019-07-18 | Disposition: A | Discharge status: Home / Self Care | Payer: Managed Care, Other (non HMO) | Source: Ambulatory Visit | Attending: Internal Medicine | Admitting: Internal Medicine

## 2019-07-18 DIAGNOSIS — Z1231 Encounter for screening mammogram for malignant neoplasm of breast: Secondary | ICD-10-CM | POA: Insufficient documentation

## 2019-09-05 ENCOUNTER — Other Ambulatory Visit: Payer: Self-pay

## 2019-09-05 ENCOUNTER — Other Ambulatory Visit (INDEPENDENT_AMBULATORY_CARE_PROVIDER_SITE_OTHER): Payer: Managed Care, Other (non HMO)

## 2019-09-05 DIAGNOSIS — Z79899 Other long term (current) drug therapy: Secondary | ICD-10-CM | POA: Diagnosis not present

## 2019-09-05 DIAGNOSIS — K754 Autoimmune hepatitis: Secondary | ICD-10-CM

## 2019-09-05 LAB — CBC WITH DIFFERENTIAL/PLATELET
Basophils Absolute: 0.1 10*3/uL (ref 0.0–0.1)
Basophils Relative: 0.9 % (ref 0.0–3.0)
Eosinophils Absolute: 0.1 10*3/uL (ref 0.0–0.7)
Eosinophils Relative: 1.8 % (ref 0.0–5.0)
HCT: 40.9 % (ref 36.0–46.0)
Hemoglobin: 13.7 g/dL (ref 12.0–15.0)
Lymphocytes Relative: 31.3 % (ref 12.0–46.0)
Lymphs Abs: 1.7 10*3/uL (ref 0.7–4.0)
MCHC: 33.6 g/dL (ref 30.0–36.0)
MCV: 95.5 fl (ref 78.0–100.0)
Monocytes Absolute: 0.6 10*3/uL (ref 0.1–1.0)
Monocytes Relative: 11.4 % (ref 3.0–12.0)
Neutro Abs: 2.9 10*3/uL (ref 1.4–7.7)
Neutrophils Relative %: 54.6 % (ref 43.0–77.0)
Platelets: 252 10*3/uL (ref 150.0–400.0)
RBC: 4.28 Mil/uL (ref 3.87–5.11)
RDW: 13.8 % (ref 11.5–15.5)
WBC: 5.4 10*3/uL (ref 4.0–10.5)

## 2019-09-05 LAB — COMPREHENSIVE METABOLIC PANEL
ALT: 17 U/L (ref 0–35)
AST: 21 U/L (ref 0–37)
Albumin: 4.5 g/dL (ref 3.5–5.2)
Alkaline Phosphatase: 64 U/L (ref 39–117)
BUN: 14 mg/dL (ref 6–23)
CO2: 28 mEq/L (ref 19–32)
Calcium: 10.2 mg/dL (ref 8.4–10.5)
Chloride: 105 mEq/L (ref 96–112)
Creatinine, Ser: 0.66 mg/dL (ref 0.40–1.20)
GFR: 90.2 mL/min (ref >60.00)
Glucose, Bld: 98 mg/dL (ref 70–99)
Potassium: 4.1 mEq/L (ref 3.5–5.1)
Sodium: 140 mEq/L (ref 135–145)
Total Bilirubin: 0.5 mg/dL (ref 0.2–1.2)
Total Protein: 7.4 g/dL (ref 6.0–8.3)

## 2019-09-05 NOTE — Addendum Note (Signed)
Addended by: Leeanne Rio on: 09/05/2019 10:28 AM   Modules accepted: Orders

## 2019-10-12 ENCOUNTER — Telehealth: Payer: Self-pay

## 2019-10-12 ENCOUNTER — Ambulatory Visit (INDEPENDENT_AMBULATORY_CARE_PROVIDER_SITE_OTHER): Payer: Managed Care, Other (non HMO) | Admitting: Internal Medicine

## 2019-10-12 DIAGNOSIS — J329 Chronic sinusitis, unspecified: Secondary | ICD-10-CM

## 2019-10-12 DIAGNOSIS — B9789 Other viral agents as the cause of diseases classified elsewhere: Secondary | ICD-10-CM

## 2019-10-12 MED ORDER — PREDNISONE 10 MG PO TABS: ORAL_TABLET | ORAL | 0 refills | Status: DC

## 2019-10-12 NOTE — Telephone Encounter (Signed)
Copied from [place] [phone]. Topic: General - Other >> Oct 12, 2019  8:50 AM Keene Breath wrote: Reason for CRM: Patient would like an appt. Today for a sinus infection.  Tried the office but line was busy 2x.  Please call patient to schedule appt. At [phone]   Patient scheduled for doxy today with Caras Silversmith, NP at [facility].

## 2019-10-12 NOTE — Progress Notes (Signed)
Virtual Visit via Video Note  I connected with Krista Olson on 10/12/19 at  3:45 PM EST by a video enabled telemedicine application and verified that I am speaking with the correct person using two identifiers.  Location: Patient: Home Provider: Office   I discussed the limitations of evaluation and management by telemedicine and the availability of in person appointments. The patient expressed understanding and agreed to proceed.  HPI:   Pt complains of nasal congestion and facial pressure for 2-3 months. This has worsened in the past 2 weeks.  She reports facial pressure under eyes that is not severe but feels it there. She is blowing green mucous out of her nose. She mentions her nose is sore due to secretions. She reports she has had sinus infections in the past but current symptoms do not follow the typically pattern of previous infection. She has tried OTC Mucinex and Afrin with minimal relief. She denies fevers, chills, cough, throat pain, itchy/watery eyes or sick contacts.    Review of Systems     Past Medical History:  Diagnosis Date  . Hepatitis, autoimmune ([place])   . Thyroid disease     Family History  Problem Relation Age of Onset  . Goiter Mother   . Heart disease Mother 12       died during valve replacement  . Kidney disease Father   . Cancer Brother 41       renal cell Ca  . Heart disease Brother        CARDIAC AREEST DURING SURGERY   . Cancer Maternal Aunt 32       breast cancer , 1of 7 sisters only  . Breast cancer Maternal Aunt 61    Social History   Socioeconomic History  . Marital status: Married    Spouse name: Not on file  . Number of children: Not on file  . Years of education: Not on file  . Highest education level: Not on file  Occupational History  . Not on file  Social Needs  . Financial resource strain: Not on file  . Food insecurity    Worry: Not on file    Inability: Not on file  . Transportation needs    Medical: Not on file   Non-medical: Not on file  Tobacco Use  . Smoking status: Never Smoker  . Smokeless tobacco: Never Used  Substance and Sexual Activity  . Alcohol use: Yes    Alcohol/week: 5.0 standard drinks    Types: 5 Glasses of wine per week    Comment: occ  . Drug use: No  . Sexual activity: Not on file  Lifestyle  . Physical activity    Days per week: Not on file    Minutes per session: Not on file  . Stress: Not on file  Relationships  . Social Herbalist on phone: Not on file    Gets together: Not on file    Attends religious service: Not on file    Active member of club or organization: Not on file    Attends meetings of clubs or organizations: Not on file    Relationship status: Not on file  . Intimate partner violence    Fear of current or ex partner: Not on file    Emotionally abused: Not on file    Physically abused: Not on file    Forced sexual activity: Not on file  Other Topics Concern  . Not on file  Social History  Narrative  . Not on file    No Known Allergies   Constitutional: Positive headache and malaise. Denies fever or abrupt weight changes.  HEENT:  Positive nasal congestion/discharge and sinus pressure. Denies eye redness, ear pain or ringing in the ears. Respiratory: Denies cough, difficulty breathing or shortness of breath.  Cardiovascular: Denies chest pain, chest tightness, palpitations or swelling in the hands or feet.   No other specific complaints in a complete review of systems (except as listed in HPI above).  Objective:   General: Appears her stated age, well developed in NAD. HEENT:  Sounds mildly congested during conversation. Throat/Mouth: sounds congested. Pulmonary/Chest: Normal effort. No respiratory distress.       Assessment & Plan:   Acute Viral Sinusitis:  Flonase 2 sprays each nostril for 3 days and then as needed. Start Zyrtec OTC RX for Pred taper X 6 days Consider Augmentin if symptoms not improving   RTC as needed  or if symptoms persist. Krista Silversmith, NP   Follow Up Instructions:    I discussed the assessment and treatment plan with the patient. The patient was provided an opportunity to ask questions and all were answered. The patient agreed with the plan and demonstrated an understanding of the instructions.   The patient was advised to call back or seek an in-person evaluation if the symptoms worsen or if the condition fails to improve as anticipated.     Krista Silversmith, NP

## 2019-10-13 ENCOUNTER — Encounter: Payer: Self-pay | Admitting: Internal Medicine

## 2019-10-13 NOTE — Patient Instructions (Signed)

## 2019-11-01 ENCOUNTER — Telehealth: Payer: Managed Care, Other (non HMO) | Admitting: Nurse Practitioner

## 2019-11-01 DIAGNOSIS — J329 Chronic sinusitis, unspecified: Secondary | ICD-10-CM | POA: Diagnosis not present

## 2019-11-01 DIAGNOSIS — B9789 Other viral agents as the cause of diseases classified elsewhere: Secondary | ICD-10-CM

## 2019-11-01 NOTE — Progress Notes (Signed)
We are sorry that you are not feeling well.  Here is how we plan to help!  Based on what you have shared with me it looks like you have sinusitis.  Sinusitis is inflammation and infection in the sinus cavities of the head.  Based on your presentation I believe you most likely have Acute Viral Sinusitis.This is an infection most likely caused by a virus. There is not specific treatment for viral sinusitis other than to help you with the symptoms until the infection runs its course.  You may use an oral decongestant such as Mucinex D or if you have glaucoma or high blood pressure use plain Mucinex. Saline nasal spray help and can safely be used as often as needed for congestion. You can use triple antibiotic ointment OTC in nasal passages.  Providers prescribe antibiotics to treat infections caused by bacteria. Antibiotics are very powerful in treating bacterial infections when they are used properly. To maintain their effectiveness, they should be used only when necessary. Overuse of antibiotics has resulted in the development of superbugs that are resistant to treatment!    After careful review of your answers, I would not recommend an antibiotic for your condition.  Antibiotics are not effective against viruses and therefore should not be used to treat them. Common examples of infections caused by viruses include colds and flu   Some authorities believe that zinc sprays or the use of Echinacea may shorten the course of your symptoms.  Sinus infections are not as easily transmitted as other respiratory infection, however we still recommend that you avoid close contact with loved ones, especially the very young and elderly.  Remember to wash your hands thoroughly throughout the day as this is the number one way to prevent the spread of infection!  Home Care:  Only take medications as instructed by your medical team.  Do not take these medications with alcohol.  A steam or ultrasonic humidifier can  help congestion.  You can place a towel over your head and breathe in the steam from hot water coming from a faucet.  Avoid close contacts especially the very young and the elderly.  Cover your mouth when you cough or sneeze.  Always remember to wash your hands.  Get Help Right Away If:  You develop worsening fever or sinus pain.  You develop a severe head ache or visual changes.  Your symptoms persist after you have completed your treatment plan.  Make sure you  Understand these instructions.  Will watch your condition.  Will get help right away if you are not doing well or get worse.  Your e-visit answers were reviewed by a board certified advanced clinical practitioner to complete your personal care plan.  Depending on the condition, your plan could have included both over the counter or prescription medications.  If there is a problem please reply  once you have received a response from your provider.  Your safety is important to [place].  If you have drug allergies check your prescription carefully.    You can use MyChart to ask questions about today's visit, request a non-urgent call back, or ask for a work or school excuse for 24 hours related to this e-Visit. If it has been greater than 24 hours you will need to follow up with your provider, or enter a new e-Visit to address those concerns.  You will get an e-mail in the next two days asking about your experience.  I hope that your e-visit has been  valuable and will speed your recovery. Thank you for using e-visits.   5-10 minutes spent reviewing and documenting in chart.

## 2019-11-25 ENCOUNTER — Other Ambulatory Visit: Payer: Self-pay | Admitting: Internal Medicine

## 2019-12-30 ENCOUNTER — Other Ambulatory Visit: Payer: Self-pay | Admitting: Internal Medicine

## 2020-02-24 ENCOUNTER — Other Ambulatory Visit: Payer: Self-pay | Admitting: Internal Medicine

## 2020-02-27 ENCOUNTER — Telehealth: Payer: Self-pay | Admitting: Internal Medicine

## 2020-02-27 DIAGNOSIS — Z79899 Other long term (current) drug therapy: Secondary | ICD-10-CM

## 2020-02-27 DIAGNOSIS — E034 Atrophy of thyroid (acquired): Secondary | ICD-10-CM

## 2020-02-27 DIAGNOSIS — E785 Hyperlipidemia, unspecified: Secondary | ICD-10-CM

## 2020-02-27 DIAGNOSIS — R7301 Impaired fasting glucose: Secondary | ICD-10-CM

## 2020-02-27 NOTE — Telephone Encounter (Signed)
Pt is due for 6 month follow up lab work this month. I have ordered TSH, lipid panel, A1c, and CMP. Is there anything else that needs to be ordered?

## 2020-02-27 NOTE — Telephone Encounter (Signed)
Pt wants to know if she is due for liver panel, and other lab work? Please advise

## 2020-03-29 ENCOUNTER — Telehealth: Payer: Self-pay | Admitting: Internal Medicine

## 2020-03-29 ENCOUNTER — Other Ambulatory Visit: Payer: Self-pay

## 2020-03-29 MED ORDER — LEVOTHYROXINE SODIUM 75 MCG PO TABS: 75.0000 ug | ORAL_TABLET | Freq: Every day | ORAL | 1 refills | Status: DC

## 2020-03-29 NOTE — Telephone Encounter (Signed)
Pt states that she has irritation in her nose and wants the same medication that she had last time? Please advise

## 2020-03-29 NOTE — Telephone Encounter (Signed)
Spoke with pt and she has been scheduled for an appt with Dr. Derrel Nip next Wednesday.

## 2020-04-03 ENCOUNTER — Telehealth (INDEPENDENT_AMBULATORY_CARE_PROVIDER_SITE_OTHER): Payer: Managed Care, Other (non HMO) | Admitting: Internal Medicine

## 2020-04-03 ENCOUNTER — Encounter: Payer: Self-pay | Admitting: Internal Medicine

## 2020-04-03 VITALS — Ht 62.52 in | Wt 130.0 lb

## 2020-04-03 DIAGNOSIS — J300 Vasomotor rhinitis: Secondary | ICD-10-CM

## 2020-04-03 DIAGNOSIS — K130 Diseases of lips: Secondary | ICD-10-CM

## 2020-04-03 DIAGNOSIS — K754 Autoimmune hepatitis: Secondary | ICD-10-CM | POA: Diagnosis not present

## 2020-04-03 DIAGNOSIS — J3481 Nasal mucositis (ulcerative): Secondary | ICD-10-CM

## 2020-04-03 MED ORDER — IPRATROPIUM BROMIDE 0.06 % NA SOLN: 2.0000 | Freq: Four times a day (QID) | NASAL | 12 refills | Status: DC

## 2020-04-03 MED ORDER — PREDNISONE 10 MG PO TABS: ORAL_TABLET | ORAL | 0 refills | Status: DC

## 2020-04-03 MED ORDER — MUPIROCIN 2 % EX OINT: 1.0000 "application " | TOPICAL_OINTMENT | Freq: Two times a day (BID) | CUTANEOUS | 0 refills | Status: DC

## 2020-04-03 NOTE — Progress Notes (Signed)
Virtual Visit via [place]  This visit type was conducted due to national recommendations for restrictions regarding the COVID-19 pandemic (e.g. social distancing).  This format is felt to be most appropriate for this patient at this time.  All issues noted in this document were discussed and addressed.  No physical exam was performed (except for noted visual exam findings with Video Visits).   I connected with@ on 04/03/20 at  2:30 PM EDT by a video enabled telemedicine application  and verified that I am speaking with the correct person using two identifiers. Location patient: home Location provider: work or home office Persons participating in the virtual visit: patient, provider  I discussed the limitations, risks, security and privacy concerns of performing an evaluation and management service by telephone and the availability of in person appointments. I also discussed with the patient that there may be a patient responsible charge related to this service. The patient expressed understanding and agreed to proceed.  Reason for visit: URI symptoms  HPI:  Persistent clear sinus drainage since the fall,  Much worse in the last 3 -4 weeks.  Some allergy symptoms.  Right eye started watering 2 days ago.  No sneezing or congestion, no headaches  Body aches or fevers.  Feels ok  Otherwise except for lips cracking.  She has been  Using saline nasal spray , Aggie Moats,    And a steroid cream prescribed by her dermatologist.  She thinks she may has a Nasal polyp on right vs ulcer,  Lips cracking at the sides.   Had similar episode in the fall and it responded to prednisone.  Taking azothioprine for autoimmune hepatitis    ROS: See pertinent positives and negatives per HPI.  Past Medical History:  Diagnosis Date  . Hepatitis, autoimmune ([place])   . Thyroid disease     Past Surgical History:  Procedure Laterality Date  . PERCUTANEOUS LIVER BIOPSY      Family History  Problem Relation Age  of Onset  . Goiter Mother   . Heart disease Mother 29       died during valve replacement  . Kidney disease Father   . Cancer Brother 71       renal cell Ca  . Heart disease Brother        CARDIAC AREEST DURING SURGERY   . Cancer Maternal Aunt 81       breast cancer , 1of 7 sisters only  . Breast cancer Maternal Aunt 19    SOCIAL HX:  reports that she has never smoked. She has never used smokeless tobacco. She reports current alcohol use of about 5.0 standard drinks of alcohol per week. She reports that she does not use drugs.   Current Outpatient Medications:  .  atorvastatin (LIPITOR) 10 MG tablet, TAKE 1 TABLET BY MOUTH AT BEDTIME, Disp: 90 tablet, Rfl: 1 .  azaTHIOprine (IMURAN) 50 MG tablet, Take 1 tablet (50 mg total) by mouth daily., Disp: 30 tablet, Rfl: 5 .  Calcium Carbonate-Vitamin D (CALCIUM + D PO), Take 1 tablet by mouth daily., Disp: , Rfl:  .  fish oil-omega-3 fatty acids 1000 MG capsule, Take 1 capsule by mouth daily., Disp: , Rfl:  .  levothyroxine (SYNTHROID) 75 MCG tablet, Take 1 tablet (75 mcg total) by mouth daily., Disp: 90 tablet, Rfl: 1 .  Multiple Vitamin (MULTIVITAMIN) tablet, Take 1 tablet by mouth daily., Disp: , Rfl:  .  ipratropium (ATROVENT) 0.06 % nasal spray, Place 2 sprays into both  nostrils 4 (four) times daily., Disp: 15 mL, Rfl: 12 .  mupirocin ointment (BACTROBAN) 2 %, Place 1 application into the nose 2 (two) times daily., Disp: 22 g, Rfl: 0 .  predniSONE (DELTASONE) 10 MG tablet, 6 tablets on Day 1 , then reduce by 1 tablet daily until gone, Disp: 21 tablet, Rfl: 0  EXAM:  VITALS per patient if applicable:  GENERAL: alert, oriented, appears well and in no acute distress  HEENT: atraumatic, conjunttiva clear, no obvious abnormalities on inspection of external nose and ears Small pimple noted at right nare opening   Mouth; bilateral fissures at mouth angles    NECK: normal movements of the head and neck  LUNGS: on inspection no signs of  respiratory distress, breathing rate appears normal, no obvious gross SOB, gasping or wheezing  CV: no obvious cyanosis  MS: moves all visible extremities without noticeable abnormality  PSYCH/NEURO: pleasant and cooperative, no obvious depression or anxiety, speech and thought processing grossly intact  ASSESSMENT AND PLAN:  Discussed the following assessment and plan:  Autoimmune hepatitis ([place]) - Plan: C-reactive protein  Angular cheilitis  Vasomotor rhinitis  Nasal mucositis (ulcerative)  Angular cheilitis Secondary to maceration and excessive moisture.  Advised to stop the steroid and keep lip corners dry  Vasomotor rhinitis Trial of atrovent nasal spray   Nasal mucositis (ulcerative) Mupirocin ointment empirically prescribed for bid use     I discussed the assessment and treatment plan with the patient. The patient was provided an opportunity to ask questions and all were answered. The patient agreed with the plan and demonstrated an understanding of the instructions.   The patient was advised to call back or seek an in-person evaluation if the symptoms worsen or if the condition fails to improve as anticipated.  I provided  30 minutes of  face-to-face time during this encounter reviewing patient's current problems and past surgeries, labs and imaging studies, providing counseling on the above mentioned problems , and coordination  of care .   Crecencio Mc, MD

## 2020-04-03 NOTE — Patient Instructions (Addendum)
Treating you for vasomotor rhinitis with atrovent  Nasal spray. This is not an antihistamine; it's an anticholinergic spray (see below)  Treating the nasal ulcer/polyp with bactroban ointment ,  Use twice daily apply with Q tip  No more saline nasal spray  Your lip issue looks like cheilitis from too much mosisture. Keep corners of mouth dry. Stop the steroid cream.    Prednisone taper,  Antibiotic ointment and Atrovent sent to [facility]   Nonallergic Rhinitis Nonallergic rhinitis is a condition that causes symptoms that affect the nose, such as a runny nose and a stuffed-up nose (nasal congestion) that can make it hard to breathe through the nose. This condition is different from having an allergy (allergic rhinitis). Allergic rhinitis occurs when the body's defense system (immune system) reacts to a substance that you are allergic to (allergen), such as pollen, pet dander, mold, or dust. Nonallergic rhinitis has many similar symptoms, but it is not caused by allergens. Nonallergic rhinitis can be a short-term or long-term problem. What are the causes? This condition can be caused by many different things. Some common types of nonallergic rhinitis include: Infectious rhinitis  This is usually due to an infection in the upper respiratory tract. Vasomotor rhinitis  This is the most common type of long-term nonallergic rhinitis.  It is caused by too much blood flow through the nose, which makes the tissue inside of the nose swell.  Symptoms are often triggered by strong odors, cold air, stress, drinking alcohol, cigarette smoke, or changes in the weather. Occupational rhinitis  This type is caused by triggers in the workplace, such as chemicals, dusts, animal dander, or air pollution. Hormonal rhinitis  This type occurs in women as a result of an increase in the female hormone estrogen.  It may occur during pregnancy, puberty, and menstrual cycles.  Symptoms improve when estrogen  levels drop. Drug-induced rhinitis Several drugs can cause nonallergic rhinitis, including:  Medicines that are used to treat high blood pressure, heart disease, and Parkinson disease.  Aspirin and NSAIDs.  Over-the-counter nasal decongestant sprays. These can cause a type of nonallergic rhinitis (rhinitis medicamentosa) when they are used for more than a few days. Nonallergic rhinitis with eosinophilia syndrome (NARES)  This type is caused by having too much of a certain type of white blood cell (eosinophil). Nonallergic rhinitis can also be caused by a reaction to eating hot or spicy foods. This does not usually cause long-term symptoms. In some cases, the cause of nonallergic rhinitis is not known. What increases the risk? You are more likely to develop this condition if:  You are 27-66 years of age.  You are a woman. Women are twice as likely to have this condition. What are the signs or symptoms? Common symptoms of this condition include:  Nasal congestion.  Runny nose.  The feeling of mucus going down the back of the throat (postnasal drip).  Trouble sleeping at night and daytime sleepiness. Less common symptoms include:  Sneezing.  Coughing.  Itchy nose.  Bloodshot eyes. How is this diagnosed? This condition may be diagnosed based on:  Your symptoms and medical history.  A physical exam.  Allergy testing to rule out allergic rhinitis. You may have skin tests or blood tests. In some cases, the health care provider may take a swab of nasal secretions to look for an increased number of eosinophils. This would be done to confirm a diagnosis of NARES. How is this treated? Treatment for this condition depends on the  cause. No single treatment works for everyone. Work with your health care provider to find the best treatment for you. Treatment may include:  Avoiding the things that trigger your symptoms.  Using medicines to relieve congestion, such as: ? Steroid  nasal spray. There are many types. You may need to try a few to find out which one works best. ? Decongestant medicine. This may be an oral medicine or a nasal spray. These medicines are only used for a short time.  Using medicines to relieve a runny nose. These may include antihistamine medicines or anticholinergic nasal sprays.  Surgery to remove tissue from inside the nose may be needed in severe cases if the condition has not improved after 6-12 months of medical treatment. Follow these instructions at home:  Take or use over-the-counter and prescription medicines only as told by your health care provider. Do not stop using your medicine even if you start to feel better.  Use salt-water (saline) rinses or other solutions (nasal washes or irrigations) to wash or rinse out the inside of your nose as told by your health care provider.  Do not take NSAIDs or medicines that contain aspirin if they make your symptoms worse.  Do not drink alcohol if it makes your symptoms worse.  Do not use any tobacco products, such as cigarettes, chewing tobacco, and e-cigarettes. If you need help quitting, ask your health care provider.  Avoid secondhand smoke.  Get some exercise every day. Exercise may help reduce symptoms of nonallergic rhinitis for some people. Ask your health care provider how much exercise and what types of exercise are safe for you.  Sleep with the head of your bed raised (elevated). This may reduce nighttime nasal congestion.  Keep all follow-up visits as told by your health care provider. This is important. Contact a health care provider if:  You have a fever.  Your symptoms are getting worse at home.  Your symptoms are not responding to medicine.  You develop new symptoms, especially a headache or nosebleed. This information is not intended to replace advice given to you by your health care provider. Make sure you discuss any questions you have with your health care  provider. Document Revised: 10/22/2017 Document Reviewed: 01/30/2016 Elsevier Patient Education  [place].

## 2020-04-04 DIAGNOSIS — J3481 Nasal mucositis (ulcerative): Secondary | ICD-10-CM | POA: Insufficient documentation

## 2020-04-04 DIAGNOSIS — K130 Diseases of lips: Secondary | ICD-10-CM | POA: Insufficient documentation

## 2020-04-04 DIAGNOSIS — J300 Vasomotor rhinitis: Secondary | ICD-10-CM | POA: Insufficient documentation

## 2020-04-04 NOTE — Assessment & Plan Note (Signed)
Secondary to maceration and excessive moisture.  Advised to stop the steroid and keep lip corners dry

## 2020-04-04 NOTE — Assessment & Plan Note (Signed)
Mupirocin ointment empirically prescribed for bid use

## 2020-04-04 NOTE — Assessment & Plan Note (Signed)
Trial of atrovent nasal spray  

## 2020-08-19 ENCOUNTER — Other Ambulatory Visit: Payer: Self-pay | Admitting: Internal Medicine

## 2020-08-19 DIAGNOSIS — Z1231 Encounter for screening mammogram for malignant neoplasm of breast: Secondary | ICD-10-CM

## 2020-08-23 ENCOUNTER — Other Ambulatory Visit: Payer: Self-pay | Admitting: Internal Medicine

## 2020-08-30 ENCOUNTER — Telehealth: Payer: Self-pay

## 2020-08-30 DIAGNOSIS — K754 Autoimmune hepatitis: Secondary | ICD-10-CM

## 2020-08-30 NOTE — Telephone Encounter (Signed)
Labs ordered and appt has been scheduled.

## 2020-09-04 ENCOUNTER — Other Ambulatory Visit (INDEPENDENT_AMBULATORY_CARE_PROVIDER_SITE_OTHER): Payer: Managed Care, Other (non HMO)

## 2020-09-04 ENCOUNTER — Other Ambulatory Visit: Payer: Self-pay

## 2020-09-04 DIAGNOSIS — K754 Autoimmune hepatitis: Secondary | ICD-10-CM | POA: Diagnosis not present

## 2020-09-04 DIAGNOSIS — R7301 Impaired fasting glucose: Secondary | ICD-10-CM | POA: Diagnosis not present

## 2020-09-04 DIAGNOSIS — E034 Atrophy of thyroid (acquired): Secondary | ICD-10-CM | POA: Diagnosis not present

## 2020-09-04 DIAGNOSIS — Z79899 Other long term (current) drug therapy: Secondary | ICD-10-CM | POA: Diagnosis not present

## 2020-09-04 DIAGNOSIS — E785 Hyperlipidemia, unspecified: Secondary | ICD-10-CM | POA: Diagnosis not present

## 2020-09-04 LAB — LIPID PANEL
Cholesterol: 219 mg/dL — ABNORMAL HIGH (ref 0–200)
HDL: 62 mg/dL (ref >39.00)
LDL Cholesterol: 133 mg/dL — ABNORMAL HIGH (ref 0–99)
NonHDL: 157.46
Total CHOL/HDL Ratio: 4
Triglycerides: 122 mg/dL (ref 0.0–149.0)
VLDL: 24.4 mg/dL (ref 0.0–40.0)

## 2020-09-04 LAB — CBC WITH DIFFERENTIAL/PLATELET
Basophils Absolute: 0.1 10*3/uL (ref 0.0–0.1)
Basophils Relative: 1 % (ref 0.0–3.0)
Eosinophils Absolute: 0.1 10*3/uL (ref 0.0–0.7)
Eosinophils Relative: 2 % (ref 0.0–5.0)
HCT: 42.9 % (ref 36.0–46.0)
Hemoglobin: 14.3 g/dL (ref 12.0–15.0)
Lymphocytes Relative: 31.4 % (ref 12.0–46.0)
Lymphs Abs: 1.8 10*3/uL (ref 0.7–4.0)
MCHC: 33.3 g/dL (ref 30.0–36.0)
MCV: 95.5 fl (ref 78.0–100.0)
Monocytes Absolute: 0.6 10*3/uL (ref 0.1–1.0)
Monocytes Relative: 11.5 % (ref 3.0–12.0)
Neutro Abs: 3 10*3/uL (ref 1.4–7.7)
Neutrophils Relative %: 54.1 % (ref 43.0–77.0)
Platelets: 240 10*3/uL (ref 150.0–400.0)
RBC: 4.49 Mil/uL (ref 3.87–5.11)
RDW: 13.8 % (ref 11.5–15.5)
WBC: 5.6 10*3/uL (ref 4.0–10.5)

## 2020-09-04 LAB — COMPREHENSIVE METABOLIC PANEL
ALT: 31 U/L (ref 0–35)
AST: 28 U/L (ref 0–37)
Albumin: 4.4 g/dL (ref 3.5–5.2)
Alkaline Phosphatase: 70 U/L (ref 39–117)
BUN: 13 mg/dL (ref 6–23)
CO2: 29 mEq/L (ref 19–32)
Calcium: 9.5 mg/dL (ref 8.4–10.5)
Chloride: 104 mEq/L (ref 96–112)
Creatinine, Ser: 0.67 mg/dL (ref 0.40–1.20)
GFR: 92.33 mL/min (ref >60.00)
Glucose, Bld: 98 mg/dL (ref 70–99)
Potassium: 4.1 mEq/L (ref 3.5–5.1)
Sodium: 140 mEq/L (ref 135–145)
Total Bilirubin: 0.7 mg/dL (ref 0.2–1.2)
Total Protein: 7.6 g/dL (ref 6.0–8.3)

## 2020-09-04 LAB — TSH: TSH: 0.33 u[IU]/mL — ABNORMAL LOW (ref 0.35–4.50)

## 2020-09-04 LAB — HEMOGLOBIN A1C: Hgb A1c MFr Bld: 6.3 % (ref 4.6–6.5)

## 2020-09-05 NOTE — Progress Notes (Signed)
Attached are your recent fasting labs,  We will discuss in detail at your upcoming visit.  Your thyroid is overactive on your current dose of levothyroxine.  If you have been taking any supplements containing biotin, please suspend them now and we will recheck your thyroid level at your upcoming visit   Regards,   Deborra Medina, MD

## 2020-09-09 ENCOUNTER — Other Ambulatory Visit: Payer: Self-pay

## 2020-09-09 ENCOUNTER — Ambulatory Visit
Admission: RE | Admit: 2020-09-09 | Discharge: 2020-09-09 | Disposition: A | Discharge status: Home / Self Care | Payer: Managed Care, Other (non HMO) | Source: Ambulatory Visit | Attending: Internal Medicine | Admitting: Internal Medicine

## 2020-09-09 DIAGNOSIS — Z1231 Encounter for screening mammogram for malignant neoplasm of breast: Secondary | ICD-10-CM | POA: Insufficient documentation

## 2020-09-10 ENCOUNTER — Other Ambulatory Visit: Payer: Self-pay

## 2020-09-10 ENCOUNTER — Ambulatory Visit (INDEPENDENT_AMBULATORY_CARE_PROVIDER_SITE_OTHER): Payer: Managed Care, Other (non HMO) | Admitting: Internal Medicine

## 2020-09-10 ENCOUNTER — Other Ambulatory Visit (HOSPITAL_COMMUNITY)
Admission: RE | Admit: 2020-09-10 | Discharge: 2020-09-10 | Disposition: A | Discharge status: Home / Self Care | Payer: Managed Care, Other (non HMO) | Source: Ambulatory Visit | Attending: Internal Medicine | Admitting: Internal Medicine

## 2020-09-10 ENCOUNTER — Encounter: Payer: Self-pay | Admitting: Internal Medicine

## 2020-09-10 VITALS — BP 110/68 | HR 73 | Temp 98.6°F | Resp 15 | Ht 62.75 in | Wt 130.8 lb

## 2020-09-10 DIAGNOSIS — E785 Hyperlipidemia, unspecified: Secondary | ICD-10-CM

## 2020-09-10 DIAGNOSIS — Z23 Encounter for immunization: Secondary | ICD-10-CM

## 2020-09-10 DIAGNOSIS — H1013 Acute atopic conjunctivitis, bilateral: Secondary | ICD-10-CM

## 2020-09-10 DIAGNOSIS — Z124 Encounter for screening for malignant neoplasm of cervix: Secondary | ICD-10-CM | POA: Insufficient documentation

## 2020-09-10 DIAGNOSIS — K754 Autoimmune hepatitis: Secondary | ICD-10-CM

## 2020-09-10 DIAGNOSIS — Z1211 Encounter for screening for malignant neoplasm of colon: Secondary | ICD-10-CM

## 2020-09-10 DIAGNOSIS — H1010 Acute atopic conjunctivitis, unspecified eye: Secondary | ICD-10-CM | POA: Insufficient documentation

## 2020-09-10 DIAGNOSIS — J309 Allergic rhinitis, unspecified: Secondary | ICD-10-CM

## 2020-09-10 DIAGNOSIS — Z0000 Encounter for general adult medical examination without abnormal findings: Secondary | ICD-10-CM | POA: Diagnosis not present

## 2020-09-10 MED ORDER — PREDNISONE 10 MG PO TABS: ORAL_TABLET | ORAL | 0 refills | Status: DC

## 2020-09-10 NOTE — Assessment & Plan Note (Addendum)
LDL and triglycerides are at goal on atorvastatin 10 mg daily.  sHe has no side effects and liver enzymes are normal. No changes today  Lab Results  Component Value Date   CHOL 219 (H) 09/04/2020   HDL 62.00 09/04/2020   LDLCALC 133 (H) 09/04/2020   LDLDIRECT 127.0 11/30/2018   TRIG 122.0 09/04/2020   CHOLHDL 4 09/04/2020   Lab Results  Component Value Date   ALT 31 09/04/2020   AST 28 09/04/2020   ALKPHOS 70 09/04/2020   BILITOT 0.7 09/04/2020

## 2020-09-10 NOTE — Assessment & Plan Note (Signed)
Treated with Imuran x 3 + years.  Repeat liver enzymes are normal. .  Annual Follow up with Dr. Drue Novel has  been done in March ,. Hepatitis A&B vaccines up to date .    Lab Results  Component Value Date   ALT 31 09/04/2020   AST 28 09/04/2020   ALKPHOS 70 09/04/2020   BILITOT 0.7 09/04/2020

## 2020-09-10 NOTE — Patient Instructions (Addendum)
Check your MVI for biotin  If  It  contains  biotin,  We need you to suspend for 3 days and recheck your thyroid level.    TO PREVENT CONSTIPATION  You need 60 ounces of water daily and 25 g fiber daily  .  YOU CAN ALSO USE COLACE STOOL SOFTENER OR METAMUCIL, MIRALAX OR CITRUCEL ON A DAILY BASIS IF NEEDED

## 2020-09-10 NOTE — Progress Notes (Signed)
Patient ID: Krista Olson, female    DOB: 04/14/1956  Age: 64 y.o. MRN: [id number]  The patient is here for annual preventive examination and management of other chronic and acute problems.  This visit occurred during the SARS-CoV-2 public health emergency.  Safety protocols were in place, including screening questions prior to the visit, additional usage of staff PPE, and extensive cleaning of exam room while observing appropriate contact time as indicated for disinfecting solutions.     The risk factors are reflected in the social history.  The roster of all physicians providing medical care to patient - is listed in the Snapshot section of the chart.  Activities of daily living:  The patient is 100% independent in all ADLs: dressing, toileting, feeding as well as independent mobility  Home safety : The patient has smoke detectors in the home. They wear seatbelts.  There are no firearms at home. There is no violence in the home.   There is no risks for hepatitis, STDs or HIV. There is no   history of blood transfusion. They have no travel history to infectious disease endemic areas of the world.  The patient has seen their dentist in the last six month. They have seen their eye doctor in the last year. They admit to slight hearing difficulty with regard to whispered voices and some television programs.  They have deferred audiologic testing in the last year.  They do not  have excessive sun exposure. Discussed the need for sun protection: hats, long sleeves and use of sunscreen if there is significant sun exposure.   Diet: the importance of a healthy diet is discussed. They do have a healthy diet.  The benefits of regular aerobic exercise were discussed. She exercises 5 days per week,  60 minutes.   Depression screen: there are no signs or vegative symptoms of depression- irritability, change in appetite, anhedonia, sadness/tearfullness.  Cognitive assessment: the patient manages all their  financial and personal affairs and is actively engaged. They could relate day,date,year and events; recalled 2/3 objects at 3 minutes; performed clock-face test normally.  The following portions of the patient's history were reviewed and updated as appropriate: allergies, current medications, past family history, past medical history,  past surgical history, past social history  and problem list.  Visual acuity was not assessed per patient preference since she has regular follow up with her ophthalmologist. Hearing and body mass index were assessed and reviewed.   During the course of the visit the patient was educated and counseled about appropriate screening and preventive services including : fall prevention , diabetes screening, nutrition counseling, colorectal cancer screening, and recommended immunizations.    CC: The primary encounter diagnosis was Colon cancer screening. Diagnoses of Need for immunization against influenza, Papanicolaou smear for cervical cancer screening, Autoimmune hepatitis ([place]), Hyperlipidemia LDL goal <160, Visit for preventive health examination, and Allergic conjunctivitis of both eyes and rhinitis were also pertinent to this visit.  History Bricelyn has a past medical history of Hepatitis, autoimmune ([place]) and Thyroid disease.   She has a past surgical history that includes Percutaneous liver biopsy.   Her family history includes Breast cancer (age of onset: 10) in her maternal aunt; Cancer (age of onset: 66) in her maternal aunt; Cancer (age of onset: 16) in her brother; Goiter in her mother; Heart disease in her brother; Heart disease (age of onset: 32) in her mother; Kidney disease in her father.She reports that she has never smoked. She has never used smokeless  tobacco. She reports current alcohol use of about 5.0 standard drinks of alcohol per week. She reports that she does not use drugs.  Outpatient Medications Prior to Visit  Medication Sig Dispense Refill  .  atorvastatin (LIPITOR) 10 MG tablet TAKE 1 TABLET BY MOUTH AT BEDTIME 90 tablet 1  . azaTHIOprine (IMURAN) 50 MG tablet Take 1 tablet (50 mg total) by mouth daily. 30 tablet 5  . Calcium Carbonate-Vitamin D (CALCIUM + D PO) Take 1 tablet by mouth daily.    . fish oil-omega-3 fatty acids 1000 MG capsule Take 1 capsule by mouth daily.    Marland Kitchen levothyroxine (SYNTHROID) 75 MCG tablet Take 1 tablet (75 mcg total) by mouth daily. 90 tablet 1  . Multiple Vitamin (MULTIVITAMIN) tablet Take 1 tablet by mouth daily.    Marland Kitchen ipratropium (ATROVENT) 0.06 % nasal spray Place 2 sprays into both nostrils 4 (four) times daily. (Patient not taking: Reported on 09/10/2020) 15 mL 12  . mupirocin ointment (BACTROBAN) 2 % Place 1 application into the nose 2 (two) times daily. (Patient not taking: Reported on 09/10/2020) 22 g 0  . predniSONE (DELTASONE) 10 MG tablet 6 tablets on Day 1 , then reduce by 1 tablet daily until gone (Patient not taking: Reported on 09/10/2020) 21 tablet 0   No facility-administered medications prior to visit.    Review of Systems   Patient denies headache, fevers, malaise, unintentional weight loss, skin rash, eye pain, sinus congestion and sinus pain, sore throat, dysphagia,  hemoptysis , cough, dyspnea, wheezing, chest pain, palpitations, orthopnea, edema, abdominal pain, nausea, melena, diarrhea, constipation, flank pain, dysuria, hematuria, urinary  Frequency, nocturia, numbness, tingling, seizures,  Focal weakness, Loss of consciousness,  Tremor, insomnia, depression, anxiety, and suicidal ideation.     Objective:  BP 110/68 (BP Location: Left Arm, Patient Position: Sitting, Cuff Size: Normal)   Pulse 73   Temp 98.6 F (37 C) (Oral)   Resp 15   Ht 5' 2.75" (1.594 m)   Wt 130 lb 12.8 oz (59.3 kg)   SpO2 98%   BMI 23.36 kg/m   Physical Exam  General Appearance:    Alert, cooperative, no distress, appears stated age  Head:    Normocephalic, without obvious abnormality, atraumatic   Eyes:    PERRL, conjunctiva erythematous without discharge,  EOM's intact, fundi    benign, both eyes  Ears:    Normal TM's and external ear canals, both ears  Nose:   Nares normal, septum midline, mucosa normal, no drainage    or sinus tenderness  Throat:   Lips, mucosa, and tongue normal; teeth and gums normal  Neck:   Supple, symmetrical, trachea midline, no adenopathy;    thyroid:  no enlargement/tenderness/nodules; no carotid   bruit or JVD  Back:     Symmetric, no curvature, ROM normal, no CVA tenderness  Lungs:     Clear to auscultation bilaterally, respirations unlabored  Chest Wall:    No tenderness or deformity   Heart:    Regular rate and rhythm, S1 and S2 normal, no murmur, rub   or gallop  Breast Exam:    No tenderness, masses, or nipple abnormality  Abdomen:     Soft, non-tender, bowel sounds active all four quadrants,    no masses, no organomegaly  Genitalia:    Pelvic: cervix normal in appearance, external genitalia normal, no adnexal masses or tenderness, no cervical motion tenderness, rectovaginal septum normal, uterus normal size, shape, and consistency and vagina normal without discharge  Extremities:   Extremities normal, atraumatic, no cyanosis or edema  Pulses:   2+ and symmetric all extremities  Skin:   Skin color, texture, turgor normal, no rashes or lesions  Lymph nodes:   Cervical, supraclavicular, and axillary nodes normal  Neurologic:   CNII-XII intact, normal strength, sensation and reflexes    throughout    Assessment & Plan:   Problem List Items Addressed This Visit      Unprioritized   Allergic conjunctivitis and rhinitis    Symptoms have persistent despite use of patonol.  Prednisone taper given       Autoimmune hepatitis ([place])    Treated with Imuran x 3 + years.  Repeat liver enzymes are normal. .  Annual Follow up with Dr. Drue Novel has  been done in March ,. Hepatitis A&B vaccines up to date .    Lab Results  Component Value Date   ALT 31  09/04/2020   AST 28 09/04/2020   ALKPHOS 70 09/04/2020   BILITOT 0.7 09/04/2020               Hyperlipidemia LDL goal <160    LDL and triglycerides are at goal on atorvastatin 10 mg daily.  sHe has no side effects and liver enzymes are normal. No changes today  Lab Results  Component Value Date   CHOL 219 (H) 09/04/2020   HDL 62.00 09/04/2020   LDLCALC 133 (H) 09/04/2020   LDLDIRECT 127.0 11/30/2018   TRIG 122.0 09/04/2020   CHOLHDL 4 09/04/2020   Lab Results  Component Value Date   ALT 31 09/04/2020   AST 28 09/04/2020   ALKPHOS 70 09/04/2020   BILITOT 0.7 09/04/2020          Visit for preventive health examination    age appropriate education and counseling updated, referrals for preventative services and immunizations addressed, dietary and smoking counseling addressed, most recent labs reviewed.  I have personally reviewed and have noted:  1) the patient's medical and social history 2) The pt's use of alcohol, tobacco, and illicit drugs 3) The patient's current medications and supplements 4) Functional ability including ADL's, fall risk, home safety risk, hearing and visual impairment 5) Diet and physical activities 6) Evidence for depression or mood disorder 7) The patient's height, weight, and BMI have been recorded in the chart  I have made referrals, and provided counseling and education based on review of the above       Other Visit Diagnoses    Colon cancer screening    -  Primary   Relevant Orders   Cologuard   Need for immunization against influenza       Relevant Orders   Flu Vaccine QUAD 36+ mos IM (Completed)   Papanicolaou smear for cervical cancer screening       Relevant Orders   Cytology - PAP( [facility] [place])      I have discontinued Santiago Glad M. Haberer's predniSONE, mupirocin ointment, and ipratropium. I am also having her start on predniSONE. Additionally, I am having her maintain her Calcium Carbonate-Vitamin D (CALCIUM + D PO),  multivitamin, fish oil-omega-3 fatty acids, azaTHIOprine, levothyroxine, and atorvastatin.  Meds ordered this encounter  Medications  . predniSONE (DELTASONE) 10 MG tablet    Sig: 6 tablets on Day 1 , then reduce by 1 tablet daily until gone    Dispense:  21 tablet    Refill:  0    Medications Discontinued During This Encounter  Medication Reason  . ipratropium (ATROVENT) 0.06 %  nasal spray   . mupirocin ointment (BACTROBAN) 2 %   . predniSONE (DELTASONE) 10 MG tablet     Follow-up: No follow-ups on file.   Crecencio Mc, MD

## 2020-09-10 NOTE — Assessment & Plan Note (Signed)
Symptoms have persistent despite use of patonol.  Prednisone taper given

## 2020-09-10 NOTE — Assessment & Plan Note (Signed)

## 2020-09-11 LAB — CYTOLOGY - PAP
Comment: NEGATIVE
Diagnosis: NEGATIVE
High risk HPV: NEGATIVE

## 2020-09-12 NOTE — Progress Notes (Signed)
  Your  PAP smear was satisfactory but the endocervical zone was not sampled due to "atrophy" (which occurs after menopause).   Since the high risk HPV screen was negative,  I recommend following the [employer]'s  Position which is to repeat  the PAP smear  one more time in three years.      Regards,   Deborra Medina, MD

## 2020-09-30 ENCOUNTER — Telehealth: Payer: Self-pay

## 2020-09-30 DIAGNOSIS — E034 Atrophy of thyroid (acquired): Secondary | ICD-10-CM

## 2020-09-30 NOTE — Telephone Encounter (Signed)
Lab has been ordered and pt has been scheduled for lab appt. Pt is aware of appt date and time.

## 2020-09-30 NOTE — Telephone Encounter (Signed)
Pt states that Dr Derrel Nip wants her to have her thyroid checked in lab. Please ask her to place those orders and schedule pt.

## 2020-10-02 ENCOUNTER — Other Ambulatory Visit: Payer: Managed Care, Other (non HMO)

## 2020-10-04 ENCOUNTER — Other Ambulatory Visit (INDEPENDENT_AMBULATORY_CARE_PROVIDER_SITE_OTHER): Payer: Managed Care, Other (non HMO)

## 2020-10-04 ENCOUNTER — Other Ambulatory Visit: Payer: Self-pay

## 2020-10-04 DIAGNOSIS — E034 Atrophy of thyroid (acquired): Secondary | ICD-10-CM

## 2020-10-04 DIAGNOSIS — K754 Autoimmune hepatitis: Secondary | ICD-10-CM

## 2020-10-04 NOTE — Addendum Note (Signed)
Addended by: Leeanne Rio on: 10/04/2020 04:23 PM   Modules accepted: Orders

## 2020-10-04 NOTE — Addendum Note (Signed)
Addended by: Leeanne Rio on: 10/04/2020 02:40 PM   Modules accepted: Orders

## 2020-10-05 LAB — TSH: TSH: 0.55 mIU/L (ref 0.40–4.50)

## 2020-10-05 LAB — C-REACTIVE PROTEIN: CRP: 2.1 mg/L (ref <8.0)

## 2020-10-07 ENCOUNTER — Telehealth: Payer: Self-pay | Admitting: Internal Medicine

## 2020-10-07 DIAGNOSIS — E034 Atrophy of thyroid (acquired): Secondary | ICD-10-CM

## 2020-10-07 MED ORDER — LEVOTHYROXINE SODIUM 75 MCG PO TABS: 75.0000 ug | ORAL_TABLET | Freq: Every day | ORAL | 1 refills | Status: DC

## 2020-10-07 NOTE — Telephone Encounter (Signed)
Pt is requesting lab results and a refill on Levothyroxine. Not sure if the dose needs to be changed or not.

## 2020-10-07 NOTE — Telephone Encounter (Signed)
Pt called to get lab results and a refill on levothyroxine (SYNTHROID) 75 MCG tablet but wanted to know if the rx needed to be changed based on her TSH lab  pt is out of medication

## 2020-10-07 NOTE — Addendum Note (Signed)
Addended by: Crecencio Mc on: 10/07/2020 01:17 PM   Modules accepted: Orders

## 2020-10-07 NOTE — Telephone Encounter (Signed)
Thyroid function was repeated and back in  normal range. Synthroid 75 mcg refilled

## 2020-10-08 NOTE — Telephone Encounter (Signed)
Pt had not see mychart message so called pt and she is aware of message.

## 2020-11-26 ENCOUNTER — Ambulatory Visit: Payer: Medicare HMO | Admitting: Dermatology

## 2020-11-26 ENCOUNTER — Other Ambulatory Visit: Payer: Self-pay

## 2020-11-26 DIAGNOSIS — D2272 Melanocytic nevi of left lower limb, including hip: Secondary | ICD-10-CM

## 2020-11-26 DIAGNOSIS — L988 Other specified disorders of the skin and subcutaneous tissue: Secondary | ICD-10-CM | POA: Diagnosis not present

## 2020-11-26 DIAGNOSIS — Z1283 Encounter for screening for malignant neoplasm of skin: Secondary | ICD-10-CM | POA: Diagnosis not present

## 2020-11-26 DIAGNOSIS — D2262 Melanocytic nevi of left upper limb, including shoulder: Secondary | ICD-10-CM

## 2020-11-26 DIAGNOSIS — L821 Other seborrheic keratosis: Secondary | ICD-10-CM

## 2020-11-26 DIAGNOSIS — D229 Melanocytic nevi, unspecified: Secondary | ICD-10-CM

## 2020-11-26 DIAGNOSIS — L578 Other skin changes due to chronic exposure to nonionizing radiation: Secondary | ICD-10-CM

## 2020-11-26 DIAGNOSIS — L814 Other melanin hyperpigmentation: Secondary | ICD-10-CM

## 2020-11-26 DIAGNOSIS — D1800 Hemangioma unspecified site: Secondary | ICD-10-CM

## 2020-11-26 NOTE — Patient Instructions (Signed)

## 2020-11-26 NOTE — Progress Notes (Signed)
   Follow-Up Visit   Subjective  Krista Olson is a 65 y.o. female who presents for the following: Annual Exam (Patient here today for total body exam. She denies any areas of concern today. She has no history of skin cancer. ).  Patient is currently on Imuran for autoimmune hepatitis. She has history of scalp psoriasis currently controlled.   The following portions of the chart were reviewed this encounter and updated as appropriate:        Objective  Well appearing patient in no apparent distress; mood and affect are within normal limits.  A full examination was performed including scalp, head, eyes, ears, nose, lips, neck, chest, axillae, abdomen, back, buttocks, bilateral upper extremities, bilateral lower extremities, hands, feet, fingers, toes, fingernails, and toenails. All findings within normal limits unless otherwise noted below.  Objective  left upper arm, right posterior thigh: 4  mm medium brown papule at left upper arm above elbow  1.5 mm medium dark brown macule at right posterior thigh  Images    Objective  frown complex: Rhytides and volume loss.   Assessment & Plan  Nevus left upper arm, right posterior thigh  Benign-appearing.  Observation.  Call clinic for new or changing moles.  Recommend daily use of broad spectrum spf 30+ sunscreen to sun-exposed areas.    Elastosis of skin frown complex  Rec. 20 units of botox on f/up to glabella   Lentigines - Scattered tan macules - Discussed due to sun exposure - Benign, observe - Call for any changes  Seborrheic Keratoses - Stuck-on, waxy, tan-brown papules and plaques - left breast  - Discussed benign etiology and prognosis. - Observe - Call for any changes  Melanocytic Nevi - Tan-brown and/or pink-flesh-colored symmetric macules and papules - Benign appearing on exam today - Observation - Call clinic for new or changing moles - Recommend daily use of broad spectrum spf 30+ sunscreen to  sun-exposed areas.   Hemangiomas - Red papules - 0.25 cm violacious papule right anterior thigh - Discussed benign nature - Observe - Call for any changes  Actinic Damage - Chronic, secondary to cumulative UV/sun exposure - diffuse scaly erythematous macules with underlying dyspigmentation - Recommend daily broad spectrum sunscreen SPF 30+ to sun-exposed areas, reapply every 2 hours as needed.  - Call for new or changing lesions.  Skin cancer screening performed today.  Return in about 1 year (around 11/26/2021) for TBSE, sooner for botox .  I, Asher Muir, CMA, am acting as scribe for Willeen Niece, MD.  Documentation: I have reviewed the above documentation for accuracy and completeness, and I agree with the above.  Willeen Niece MD

## 2020-12-23 DIAGNOSIS — Z1211 Encounter for screening for malignant neoplasm of colon: Secondary | ICD-10-CM

## 2020-12-31 ENCOUNTER — Encounter: Payer: Self-pay | Admitting: Internal Medicine

## 2020-12-31 ENCOUNTER — Telehealth (INDEPENDENT_AMBULATORY_CARE_PROVIDER_SITE_OTHER): Payer: Medicare HMO | Admitting: Internal Medicine

## 2020-12-31 DIAGNOSIS — J300 Vasomotor rhinitis: Secondary | ICD-10-CM | POA: Diagnosis not present

## 2020-12-31 MED ORDER — PREDNISONE 10 MG PO TABS
ORAL_TABLET | ORAL | 0 refills | Status: DC
Start: 2020-12-31 — End: 2021-02-24

## 2020-12-31 NOTE — Progress Notes (Signed)
Virtual Visit via [place]   This visit type was conducted due to national recommendations for restrictions regarding the COVID-19 pandemic (e.g. social distancing).  This format is felt to be most appropriate for this patient at this time.  All issues noted in this document were discussed and addressed.  No physical exam was performed (except for noted visual exam findings with Video Visits).   I connected with@ on 12/31/20 at  3:30 PM EST by a video enabled telemedicine application  and verified that I am speaking with the correct person using two identifiers. Location patient: home Location provider: work or home office Persons participating in the virtual visit: patient, provider  I discussed the limitations, risks, security and privacy concerns of performing an evaluation and management service by telephone and the availability of in person appointments. I also discussed with the patient that there may be a patient responsible charge related to this service. The patient expressed understanding and agreed to proceed.   Reason for visit: sinus congestion x 3 weeks   HPI:   65 yr old Krista Olson presents with sinus congestion that started 3 weeks ago .  She denies  Cough,  body aches, and sore throat. Has been taking allegra and sudafed.  Some improvement but still reporting congestion without facial pain or ear pain .  Drainage has been clear.   2) travelling to the [place] in April.  Needs 1) levaquin) prednisone,3) alprazolam for the flight   ROS: See pertinent positives and negatives per HPI.  Past Medical History:  Diagnosis Date   Hepatitis, autoimmune ([place])    Thyroid disease     Past Surgical History:  Procedure Laterality Date   PERCUTANEOUS LIVER BIOPSY      Family History  Problem Relation Age of Onset   Goiter Mother    Heart disease Mother 67       died during valve replacement   Kidney disease Father    Cancer Brother 78       renal  cell Ca   Heart disease Brother        CARDIAC AREEST DURING SURGERY    Cancer Maternal Aunt 58       breast cancer , 1of 7 sisters only   Breast cancer Maternal Aunt 55    SOCIAL HX:  reports that she has never smoked. She has never used smokeless tobacco. She reports current alcohol use of about 5.0 standard drinks of alcohol per week. She reports that she does not use drugs.   Current Outpatient Medications:    ascorbic acid (VITAMIN C) 250 MG CHEW, Chew by mouth., Disp: , Rfl:    atorvastatin (LIPITOR) 10 MG tablet, TAKE 1 TABLET BY MOUTH AT BEDTIME, Disp: 90 tablet, Rfl: 1   azaTHIOprine (IMURAN) 50 MG tablet, Take 1 tablet (50 mg total) by mouth daily., Disp: 30 tablet, Rfl: 5   Calcium Carbonate-Vitamin D (CALCIUM + D PO), Take 1 tablet by mouth daily., Disp: , Rfl:    fish oil-omega-3 fatty acids 1000 MG capsule, Take 1 capsule by mouth daily., Disp: , Rfl:    levothyroxine (SYNTHROID) 75 MCG tablet, Take 1 tablet (75 mcg total) by mouth daily., Disp: 90 tablet, Rfl: 1   Multiple Vitamin (MULTIVITAMIN) tablet, Take 1 tablet by mouth daily., Disp: , Rfl:    prednisoLONE acetate (PRED FORTE) 1 % ophthalmic suspension, , Disp: , Rfl:    predniSONE (DELTASONE) 10 MG tablet, 6 tablets on Day 1 , then reduce by  1 tablet daily until gone, Disp: 21 tablet, Rfl: 0  EXAM:  VITALS per patient if applicable:  GENERAL: alert, oriented, appears well and in no acute distress  HEENT: atraumatic, conjunttiva clear, no obvious abnormalities on inspection of external nose and ears  NECK: normal movements of the head and neck  LUNGS: on inspection no signs of respiratory distress, breathing rate appears normal, no obvious gross SOB, gasping or wheezing  CV: no obvious cyanosis  MS: moves all visible extremities without noticeable abnormality  PSYCH/NEURO: pleasant and cooperative, no obvious depression or anxiety, speech and thought processing grossly intact  ASSESSMENT AND  PLAN:  Discussed the following assessment and plan:  Vasomotor rhinitis  Vasomotor rhinitis No response to otc meds x 3 weeks.  Prednisone taper     I discussed the assessment and treatment plan with the patient. The patient was provided an opportunity to ask questions and all were answered. The patient agreed with the plan and demonstrated an understanding of the instructions.   The patient was advised to call back or seek an in-person evaluation if the symptoms worsen or if the condition fails to improve as anticipated.   I spent 20 minutes dedicated to the care of this patient on the date of this encounter to include pre-visit review of her medical history,  Face-to-face time with the patient , and post visit ordering of testing and therapeutics.    Crecencio Mc, MD

## 2021-01-01 NOTE — Assessment & Plan Note (Signed)
No response to otc meds x 3 weeks.  Prednisone taper

## 2021-01-09 LAB — COLOGUARD: Cologuard: NEGATIVE

## 2021-01-16 DIAGNOSIS — E785 Hyperlipidemia, unspecified: Secondary | ICD-10-CM

## 2021-01-16 DIAGNOSIS — E034 Atrophy of thyroid (acquired): Secondary | ICD-10-CM

## 2021-01-16 DIAGNOSIS — K754 Autoimmune hepatitis: Secondary | ICD-10-CM

## 2021-01-16 DIAGNOSIS — R7303 Prediabetes: Secondary | ICD-10-CM

## 2021-01-17 LAB — COLOGUARD: COLOGUARD: NEGATIVE

## 2021-02-04 ENCOUNTER — Other Ambulatory Visit (INDEPENDENT_AMBULATORY_CARE_PROVIDER_SITE_OTHER): Payer: Medicare HMO

## 2021-02-04 ENCOUNTER — Other Ambulatory Visit: Payer: Self-pay

## 2021-02-04 DIAGNOSIS — K754 Autoimmune hepatitis: Secondary | ICD-10-CM | POA: Diagnosis not present

## 2021-02-04 DIAGNOSIS — E785 Hyperlipidemia, unspecified: Secondary | ICD-10-CM

## 2021-02-04 DIAGNOSIS — R7303 Prediabetes: Secondary | ICD-10-CM | POA: Diagnosis not present

## 2021-02-04 DIAGNOSIS — E034 Atrophy of thyroid (acquired): Secondary | ICD-10-CM

## 2021-02-04 LAB — LIPID PANEL
Cholesterol: 214 mg/dL — ABNORMAL HIGH (ref 0–200)
HDL: 62.4 mg/dL (ref >39.00)
LDL Cholesterol: 126 mg/dL — ABNORMAL HIGH (ref 0–99)
NonHDL: 151.29
Total CHOL/HDL Ratio: 3
Triglycerides: 127 mg/dL (ref 0.0–149.0)
VLDL: 25.4 mg/dL (ref 0.0–40.0)

## 2021-02-04 LAB — CBC WITH DIFFERENTIAL/PLATELET
Basophils Absolute: 0.1 10*3/uL (ref 0.0–0.1)
Basophils Relative: 1.1 % (ref 0.0–3.0)
Eosinophils Absolute: 0.1 10*3/uL (ref 0.0–0.7)
Eosinophils Relative: 2 % (ref 0.0–5.0)
HCT: 43.8 % (ref 36.0–46.0)
Hemoglobin: 14.6 g/dL (ref 12.0–15.0)
Lymphocytes Relative: 31.3 % (ref 12.0–46.0)
Lymphs Abs: 1.6 10*3/uL (ref 0.7–4.0)
MCHC: 33.4 g/dL (ref 30.0–36.0)
MCV: 94.2 fl (ref 78.0–100.0)
Monocytes Absolute: 0.6 10*3/uL (ref 0.1–1.0)
Monocytes Relative: 10.7 % (ref 3.0–12.0)
Neutro Abs: 2.9 10*3/uL (ref 1.4–7.7)
Neutrophils Relative %: 54.9 % (ref 43.0–77.0)
Platelets: 273 10*3/uL (ref 150.0–400.0)
RBC: 4.65 Mil/uL (ref 3.87–5.11)
RDW: 13.7 % (ref 11.5–15.5)
WBC: 5.2 10*3/uL (ref 4.0–10.5)

## 2021-02-04 LAB — COMPREHENSIVE METABOLIC PANEL
ALT: 18 U/L (ref 0–35)
AST: 20 U/L (ref 0–37)
Albumin: 4.3 g/dL (ref 3.5–5.2)
Alkaline Phosphatase: 76 U/L (ref 39–117)
BUN: 11 mg/dL (ref 6–23)
CO2: 29 mEq/L (ref 19–32)
Calcium: 10.1 mg/dL (ref 8.4–10.5)
Chloride: 103 mEq/L (ref 96–112)
Creatinine, Ser: 0.59 mg/dL (ref 0.40–1.20)
GFR: 94.67 mL/min (ref >60.00)
Glucose, Bld: 95 mg/dL (ref 70–99)
Potassium: 4.8 mEq/L (ref 3.5–5.1)
Sodium: 140 mEq/L (ref 135–145)
Total Bilirubin: 0.6 mg/dL (ref 0.2–1.2)
Total Protein: 7.3 g/dL (ref 6.0–8.3)

## 2021-02-04 LAB — HEMOGLOBIN A1C: Hgb A1c MFr Bld: 6 % (ref 4.6–6.5)

## 2021-02-04 LAB — TSH: TSH: 0.06 u[IU]/mL — ABNORMAL LOW (ref 0.35–4.50)

## 2021-02-09 ENCOUNTER — Other Ambulatory Visit: Payer: Self-pay | Admitting: Internal Medicine

## 2021-02-09 DIAGNOSIS — E034 Atrophy of thyroid (acquired): Secondary | ICD-10-CM

## 2021-02-09 NOTE — Progress Notes (Signed)
Thyroid function has become  overactive on current dose of 75 mcg daily .  I recommend that you omit one dose per week and would like you to change  your dose and repeat your  TSH in 6 weeks

## 2021-02-13 ENCOUNTER — Telehealth: Payer: Self-pay | Admitting: Internal Medicine

## 2021-02-13 NOTE — Telephone Encounter (Signed)
The patient is leaving the country and would like the medications that were discussed at the visit her provider called into the pharmacy. She could not relay what those medications were.

## 2021-02-14 MED ORDER — LEVOFLOXACIN 500 MG PO TABS: 500.0000 mg | ORAL_TABLET | Freq: Every day | ORAL | 0 refills | Status: DC

## 2021-02-14 MED ORDER — ONDANSETRON HCL 4 MG PO TABS: 4.0000 mg | ORAL_TABLET | Freq: Three times a day (TID) | ORAL | 0 refills | Status: DC | PRN

## 2021-02-14 NOTE — Telephone Encounter (Signed)
zofran for nausea Levaquin, an antibiotic for UTIs and bronchitis  Both sent

## 2021-02-14 NOTE — Telephone Encounter (Signed)
The patient is leaving the country and would like the medications that were discussed at the visit her provider called into the pharmacy at 01/20/21 visit nothing recorded in notes?

## 2021-02-14 NOTE — Telephone Encounter (Signed)
Tried to notify patient of medications called to pharmacy no answer left voicemail to call office.

## 2021-02-24 ENCOUNTER — Encounter: Payer: Self-pay | Admitting: Internal Medicine

## 2021-02-24 ENCOUNTER — Telehealth (INDEPENDENT_AMBULATORY_CARE_PROVIDER_SITE_OTHER): Payer: Medicare HMO | Admitting: Internal Medicine

## 2021-02-24 DIAGNOSIS — J309 Allergic rhinitis, unspecified: Secondary | ICD-10-CM

## 2021-02-24 DIAGNOSIS — K130 Diseases of lips: Secondary | ICD-10-CM | POA: Diagnosis not present

## 2021-02-24 DIAGNOSIS — H1013 Acute atopic conjunctivitis, bilateral: Secondary | ICD-10-CM | POA: Diagnosis not present

## 2021-02-24 MED ORDER — ALPRAZOLAM 0.25 MG PO TABS: 0.2500 mg | ORAL_TABLET | Freq: Two times a day (BID) | ORAL | 0 refills | Status: DC | PRN

## 2021-02-24 MED ORDER — MUPIROCIN 2 % EX OINT: 1.0000 "application " | TOPICAL_OINTMENT | Freq: Two times a day (BID) | CUTANEOUS | 0 refills | Status: AC

## 2021-02-24 MED ORDER — PREDNISONE 10 MG PO TABS: ORAL_TABLET | ORAL | 0 refills | Status: DC

## 2021-02-24 NOTE — Patient Instructions (Signed)
The prednisone taper has been doubled so you can take a 6 day course with you to [facility]   The ointment is medicated to treat staph infections.  You can use it on your lip creases or apply with a 1 tip to the inside of your  nose if you develop Any sores inside    The alprazolam can be used as needed during your flights to help you relax.  Do NOT combine with alcohol

## 2021-02-24 NOTE — Progress Notes (Signed)
Virtual Visit via [facility]  This visit type was conducted due to national recommendations for restrictions regarding the COVID-19 pandemic (e.g. social distancing).  This format is felt to be most appropriate for this patient at this time.  All issues noted in this document were discussed and addressed.  No physical exam was performed (except for noted visual exam findings with Video Visits).   I connected with@ on 02/24/21 at  4:30 PM EDT by a video enabled telemedicine application and verified that I am speaking with the correct person using two identifiers. Location patient: home Location provider: work or home office Persons participating in the virtual visit: patient, provider  I discussed the limitations, risks, security and privacy concerns of performing an evaluation and management service by telephone and the availability of in person appointments. I also discussed with the patient that there may be a patient responsible charge related to this service. The patient expressed understanding and agreed to proceed.  Reason for visit: rhinitis, cheilitis   HPI:  65 yr old female with autoimmune hepatitis presents with one week history of rhinitis with sneezing and congestion but without purulent drainage, fevers,  Or body aches,  No  facial pain . Has developed bilateral cheilitis due to frequent licking of lips .  Leaving for [place] in 5 days. Some persistent nasal irritation.    ROS: See pertinent positives and negatives per HPI.  Past Medical History:  Diagnosis Date  . Hepatitis, autoimmune ([place])   . Thyroid disease     Past Surgical History:  Procedure Laterality Date  . PERCUTANEOUS LIVER BIOPSY      Family History  Problem Relation Age of Onset  . Goiter Mother   . Heart disease Mother 65       died during valve replacement  . Kidney disease Father   . Cancer Brother 31       renal cell Ca  . Heart disease Brother        CARDIAC AREEST DURING SURGERY   . Cancer  Maternal Aunt 42       breast cancer , 1of 7 sisters only  . Breast cancer Maternal Aunt 6    SOCIAL HX:  reports that she has never smoked. She has never used smokeless tobacco. She reports current alcohol use of about 5.0 standard drinks of alcohol per week. She reports that she does not use drugs.   Current Outpatient Medications:  .  ALPRAZolam (XANAX) 0.25 MG tablet, Take 1 tablet (0.25 mg total) by mouth 2 (two) times daily as needed (fear of flying)., Disp: 10 tablet, Rfl: 0 .  ascorbic acid (VITAMIN C) 250 MG CHEW, Chew by mouth., Disp: , Rfl:  .  atorvastatin (LIPITOR) 10 MG tablet, TAKE 1 TABLET BY MOUTH AT BEDTIME, Disp: 90 tablet, Rfl: 1 .  azaTHIOprine (IMURAN) 50 MG tablet, Take 1 tablet (50 mg total) by mouth daily., Disp: 30 tablet, Rfl: 5 .  Calcium Carbonate-Vitamin D (CALCIUM + D PO), Take 1 tablet by mouth daily., Disp: , Rfl:  .  fish oil-omega-3 fatty acids 1000 MG capsule, Take 1 capsule by mouth daily., Disp: , Rfl:  .  levothyroxine (SYNTHROID) 75 MCG tablet, Take 1 tablet (75 mcg total) by mouth daily., Disp: 90 tablet, Rfl: 1 .  mupirocin ointment (BACTROBAN) 2 %, Apply 1 application topically 2 (two) times daily for 7 days., Disp: 30 g, Rfl: 0 .  ondansetron (ZOFRAN) 4 MG tablet, Take 1 tablet (4 mg total) by mouth every  8 (eight) hours as needed for nausea or vomiting., Disp: 20 tablet, Rfl: 0 .  levofloxacin (LEVAQUIN) 500 MG tablet, Take 1 tablet (500 mg total) by mouth daily. (Patient not taking: Reported on 02/24/2021), Disp: 7 tablet, Rfl: 0 .  predniSONE (DELTASONE) 10 MG tablet, 6 tablets daily for 2 days , then reduce by 1 tablet every 2 days  until gone, Disp: 42 tablet, Rfl: 0  EXAM:  VITALS per patient if applicable:  GENERAL: alert, oriented, appears well and in no acute distress  HEENT: atraumatic, conjunttiva clear, no obvious abnormalities on inspection of external nose and ears  NECK: normal movements of the head and neck  LUNGS: on  inspection no signs of respiratory distress, breathing rate appears normal, no obvious gross SOB, gasping or wheezing  CV: no obvious cyanosis  MS: moves all visible extremities without noticeable abnormality  PSYCH/NEURO: pleasant and cooperative, no obvious depression or anxiety, speech and thought processing grossly intact  ASSESSMENT AND PLAN:  Discussed the following assessment and plan:  Allergic conjunctivitis of both eyes and rhinitis  Angular cheilitis  Allergic conjunctivitis and rhinitis No control with  steroid nasal spray.  Advised to add steroid taper, once daily  antihistamine to manage secretions,  And mupirocin for coverage of MRSA   Angular cheilitis Secondary to maceration and excessive moisture.  Advised to use mupirocin.   lip corners dry    I discussed the assessment and treatment plan with the patient. The patient was provided an opportunity to ask questions and all were answered. The patient agreed with the plan and demonstrated an understanding of the instructions.   The patient was advised to call back or seek an in-person evaluation if the symptoms worsen or if the condition fails to improve as anticipated.    Crecencio Mc, MD

## 2021-02-25 NOTE — Assessment & Plan Note (Addendum)
No control with  steroid nasal spray.  Advised to add steroid taper, once daily  antihistamine to manage secretions,  And mupirocin for coverage of MRSA

## 2021-02-25 NOTE — Assessment & Plan Note (Signed)
Secondary to maceration and excessive moisture.  Advised to use mupirocin.   lip corners dry

## 2021-04-10 ENCOUNTER — Other Ambulatory Visit: Payer: Self-pay | Admitting: Internal Medicine

## 2021-05-28 ENCOUNTER — Other Ambulatory Visit: Payer: Self-pay | Admitting: Internal Medicine

## 2021-07-14 ENCOUNTER — Other Ambulatory Visit: Payer: Self-pay

## 2021-07-14 ENCOUNTER — Ambulatory Visit
Admission: EM | Admit: 2021-07-14 | Discharge: 2021-07-14 | Disposition: A | Discharge status: Home / Self Care | Payer: Medicare HMO | Attending: Emergency Medicine | Admitting: Emergency Medicine

## 2021-07-14 ENCOUNTER — Encounter: Payer: Self-pay | Admitting: Emergency Medicine

## 2021-07-14 DIAGNOSIS — N390 Urinary tract infection, site not specified: Secondary | ICD-10-CM | POA: Diagnosis not present

## 2021-07-14 DIAGNOSIS — R319 Hematuria, unspecified: Secondary | ICD-10-CM | POA: Insufficient documentation

## 2021-07-14 LAB — POCT URINALYSIS DIP (MANUAL ENTRY)
Bilirubin, UA: NEGATIVE
Glucose, UA: NEGATIVE mg/dL
Ketones, POC UA: NEGATIVE mg/dL
Nitrite, UA: POSITIVE — AB
Protein Ur, POC: 100 mg/dL — AB
Spec Grav, UA: >=1.03 — AB (ref 1.010–1.025)
Urobilinogen, UA: 0.2 E.U./dL
pH, UA: 5.5 (ref 5.0–8.0)

## 2021-07-14 MED ORDER — CEPHALEXIN 500 MG PO CAPS: 500.0000 mg | ORAL_CAPSULE | Freq: Two times a day (BID) | ORAL | 0 refills | Status: AC

## 2021-07-14 NOTE — Discharge Instructions (Addendum)
Take the antibiotic as directed.  The urine culture is pending.  We will call you if it shows the need to change or discontinue your antibiotic.    Follow up with your primary care provider if your symptoms are not improving.    

## 2021-07-14 NOTE — ED Provider Notes (Signed)
Roderic Palau    CSN: [id number] Arrival date & time: 07/14/21  1447      History   Chief Complaint Chief Complaint  Patient presents with   Urinary Frequency   Dysuria    HPI BRIANA GARRELTS is a 65 y.o. female.  Patient presents with 2-day history of dysuria and urinary frequency.  She denies fever, chills, abdominal pain, flank pain, vaginal discharge, pelvic pain, or other symptoms.  No treatments attempted at home.  Her medical history includes autoimmune hepatitis, hyperlipidemia, hypothyroidism.  The history is provided by the patient and medical records.   Past Medical History:  Diagnosis Date   Hepatitis, autoimmune ([place])    Thyroid disease     Patient Active Problem List   Diagnosis Date Noted   Allergic conjunctivitis and rhinitis 09/10/2020   Angular cheilitis 04/04/2020   Vasomotor rhinitis 04/04/2020   Nasal mucositis (ulcerative) 04/04/2020   Hypothyroidism 09/28/2015   Visit for preventive health examination 01/31/2013   Menopause syndrome 12/17/2011   Autoimmune hepatitis ([place]) 12/16/2011   Hyperlipidemia LDL goal <160 12/16/2011    Past Surgical History:  Procedure Laterality Date   PERCUTANEOUS LIVER BIOPSY      OB History   No obstetric history on file.      Home Medications    Prior to Admission medications   Medication Sig Start Date End Date Taking? Authorizing Provider  atorvastatin (LIPITOR) 10 MG tablet TAKE 1 TABLET BY MOUTH AT BEDTIME 05/28/21  Yes Crecencio Mc, MD  azaTHIOprine (IMURAN) 50 MG tablet Take 1 tablet (50 mg total) by mouth daily. 11/03/17  Yes Crecencio Mc, MD  Calcium Carbonate-Vitamin D (CALCIUM + D PO) Take 1 tablet by mouth daily.   Yes [provider]  cephALEXin (KEFLEX) 500 MG capsule Take 1 capsule (500 mg total) by mouth 2 (two) times daily for 5 days. 07/14/21 07/19/21 Yes Sharion Balloon, NP  fish oil-omega-3 fatty acids 1000 MG capsule Take 1 capsule by mouth daily.   Yes [provider]  levothyroxine (SYNTHROID) 75 MCG tablet TAKE 1 TABLET BY MOUTH ONCE DAILY ON AN EMPTY STOMACH. WAIT 30 MINUTES BEFORE TAKING OTHER MEDS. 04/10/21  Yes Crecencio Mc, MD  ALPRAZolam Duanne Moron) 0.25 MG tablet Take 1 tablet (0.25 mg total) by mouth 2 (two) times daily as needed (fear of flying). 02/24/21   Crecencio Mc, MD  ascorbic acid (VITAMIN C) 250 MG CHEW Chew by mouth.    [provider]  levofloxacin (LEVAQUIN) 500 MG tablet Take 1 tablet (500 mg total) by mouth daily. Patient not taking: No sig reported 02/14/21   Crecencio Mc, MD  ondansetron (ZOFRAN) 4 MG tablet Take 1 tablet (4 mg total) by mouth every 8 (eight) hours as needed for nausea or vomiting. 02/14/21   Crecencio Mc, MD  predniSONE (DELTASONE) 10 MG tablet 6 tablets daily for 2 days , then reduce by 1 tablet every 2 days  until gone 02/24/21   Crecencio Mc, MD    Family History Family History  Problem Relation Age of Onset   Goiter Mother    Heart disease Mother 18       died during valve replacement   Kidney disease Father    Cancer Brother 81       renal cell Ca   Heart disease Brother        CARDIAC AREEST DURING SURGERY    Cancer Maternal Aunt 16  breast cancer , 1of 7 sisters only   Breast cancer Maternal Aunt 50    Social History Social History   Tobacco Use   Smoking status: Never   Smokeless tobacco: Never  Substance Use Topics   Alcohol use: Yes    Alcohol/week: 5.0 standard drinks    Types: 5 Glasses of wine per week    Comment: occ   Drug use: No     Allergies   Patient has no known allergies.   Review of Systems Review of Systems  Constitutional:  Negative for chills and fever.  Respiratory:  Negative for cough and shortness of breath.   Cardiovascular:  Negative for chest pain and palpitations.  Gastrointestinal:  Negative for abdominal pain and vomiting.  Genitourinary:  Positive for dysuria and frequency. Negative for flank pain, hematuria, pelvic  pain and vaginal discharge.  Skin:  Negative for color change and rash.  All other systems reviewed and are negative.   Physical Exam Triage Vital Signs ED Triage Vitals  Enc Vitals Group     BP      Pulse      Resp      Temp      Temp src      SpO2      Weight      Height      Head Circumference      Peak Flow      Pain Score      Pain Loc      Pain Edu?      Excl. in [place]?    No data found.  Updated Vital Signs BP (!) 148/83 (BP Location: Left Arm)   Pulse 86   Temp 98.9 F (37.2 C) (Oral)   Resp 18   SpO2 97%   Visual Acuity Right Eye Distance:   Left Eye Distance:   Bilateral Distance:    Right Eye Near:   Left Eye Near:    Bilateral Near:     Physical Exam Vitals and nursing note reviewed.  Constitutional:      General: She is not in acute distress.    Appearance: She is well-developed.  HENT:     Head: Normocephalic and atraumatic.     Mouth/Throat:     Mouth: Mucous membranes are moist.  Eyes:     Conjunctiva/sclera: Conjunctivae normal.  Cardiovascular:     Rate and Rhythm: Normal rate and regular rhythm.     Heart sounds: Normal heart sounds.  Pulmonary:     Effort: Pulmonary effort is normal. No respiratory distress.     Breath sounds: Normal breath sounds.  Abdominal:     General: Bowel sounds are normal.     Palpations: Abdomen is soft.     Tenderness: There is no abdominal tenderness. There is no right CVA tenderness, left CVA tenderness, guarding or rebound.  Musculoskeletal:     Cervical back: Neck supple.  Skin:    General: Skin is warm and dry.  Neurological:     General: No focal deficit present.     Mental Status: She is alert and oriented to person, place, and time.     Gait: Gait normal.  Psychiatric:        Mood and Affect: Mood normal.        Behavior: Behavior normal.     UC Treatments / Results  Labs (all labs ordered are listed, but only abnormal results are displayed) Labs Reviewed  POCT URINALYSIS DIP (MANUAL  ENTRY) -  Abnormal; Notable for the following components:      Result Value   Clarity, UA cloudy (*)    Spec Grav, UA >=1.030 (*)    Blood, UA moderate (*)    Protein Ur, POC =100 (*)    Nitrite, UA Positive (*)    Leukocytes, UA Large (3+) (*)    All other components within normal limits  URINE CULTURE    EKG   Radiology No results found.  Procedures Procedures (including critical care time)  Medications Ordered in UC Medications - No data to display  Initial Impression / Assessment and Plan / UC Course  I have reviewed the triage vital signs and the nursing notes.  Pertinent labs & imaging results that were available during my care of the patient were reviewed by me and considered in my medical decision making (see chart for details).   UTI.  Treating with Keflex. Urine culture pending. Discussed with patient that we will call her if the urine culture shows the need to change or discontinue the antibiotic. Instructed her to follow-up with her PCP if her symptoms are not improving. Patient agrees to plan of care.      Final Clinical Impressions(s) / UC Diagnoses   Final diagnoses:  Urinary tract infection with hematuria, site unspecified     Discharge Instructions      Take the antibiotic as directed.  The urine culture is pending.  We will call you if it shows the need to change or discontinue your antibiotic.    Follow up with your primary care provider if your symptoms are not improving.         ED Prescriptions     Medication Sig Dispense Auth. Provider   cephALEXin (KEFLEX) 500 MG capsule Take 1 capsule (500 mg total) by mouth 2 (two) times daily for 5 days. 10 capsule Sharion Balloon, NP      PDMP not reviewed this encounter.   Sharion Balloon, NP 07/14/21 [phone]

## 2021-07-14 NOTE — ED Triage Notes (Signed)
Patient c/o urinary frequency and dysuria x 2 days.   Patient denies fever, changes in urine characteristics, vaginal discharge, ABD pain, and N/V/D.   Patient states " I have a weird feeling after peeing".   Patient hasn't taken any medications for symptoms.

## 2021-07-17 LAB — URINE CULTURE: Culture: >=100000 — AB

## 2021-07-23 DIAGNOSIS — E034 Atrophy of thyroid (acquired): Secondary | ICD-10-CM

## 2021-07-23 DIAGNOSIS — R7303 Prediabetes: Secondary | ICD-10-CM

## 2021-07-23 DIAGNOSIS — E785 Hyperlipidemia, unspecified: Secondary | ICD-10-CM

## 2021-07-23 DIAGNOSIS — K754 Autoimmune hepatitis: Secondary | ICD-10-CM

## 2021-07-25 NOTE — Telephone Encounter (Signed)
Fasting labs ordered

## 2021-07-29 NOTE — Telephone Encounter (Signed)
Patient scheduled.

## 2021-07-30 ENCOUNTER — Other Ambulatory Visit: Payer: Self-pay

## 2021-07-30 ENCOUNTER — Other Ambulatory Visit (INDEPENDENT_AMBULATORY_CARE_PROVIDER_SITE_OTHER): Payer: Medicare HMO

## 2021-07-30 DIAGNOSIS — E785 Hyperlipidemia, unspecified: Secondary | ICD-10-CM | POA: Diagnosis not present

## 2021-07-30 DIAGNOSIS — K754 Autoimmune hepatitis: Secondary | ICD-10-CM | POA: Diagnosis not present

## 2021-07-30 DIAGNOSIS — E034 Atrophy of thyroid (acquired): Secondary | ICD-10-CM

## 2021-07-30 DIAGNOSIS — R7303 Prediabetes: Secondary | ICD-10-CM | POA: Diagnosis not present

## 2021-07-30 LAB — CBC WITH DIFFERENTIAL/PLATELET
Basophils Absolute: 0.1 10*3/uL (ref 0.0–0.1)
Basophils Relative: 1.1 % (ref 0.0–3.0)
Eosinophils Absolute: 0.1 10*3/uL (ref 0.0–0.7)
Eosinophils Relative: 1.9 % (ref 0.0–5.0)
HCT: 43.2 % (ref 36.0–46.0)
Hemoglobin: 14.3 g/dL (ref 12.0–15.0)
Lymphocytes Relative: 27.1 % (ref 12.0–46.0)
Lymphs Abs: 1.5 10*3/uL (ref 0.7–4.0)
MCHC: 33.1 g/dL (ref 30.0–36.0)
MCV: 95.4 fl (ref 78.0–100.0)
Monocytes Absolute: 0.6 10*3/uL (ref 0.1–1.0)
Monocytes Relative: 10.2 % (ref 3.0–12.0)
Neutro Abs: 3.3 10*3/uL (ref 1.4–7.7)
Neutrophils Relative %: 59.7 % (ref 43.0–77.0)
Platelets: 240 10*3/uL (ref 150.0–400.0)
RBC: 4.53 Mil/uL (ref 3.87–5.11)
RDW: 13.9 % (ref 11.5–15.5)
WBC: 5.6 10*3/uL (ref 4.0–10.5)

## 2021-07-30 LAB — COMPREHENSIVE METABOLIC PANEL
ALT: 13 U/L (ref 0–35)
AST: 17 U/L (ref 0–37)
Albumin: 4.5 g/dL (ref 3.5–5.2)
Alkaline Phosphatase: 65 U/L (ref 39–117)
BUN: 11 mg/dL (ref 6–23)
CO2: 27 mEq/L (ref 19–32)
Calcium: 9.8 mg/dL (ref 8.4–10.5)
Chloride: 103 mEq/L (ref 96–112)
Creatinine, Ser: 0.65 mg/dL (ref 0.40–1.20)
GFR: 92.17 mL/min (ref >60.00)
Glucose, Bld: 98 mg/dL (ref 70–99)
Potassium: 4.6 mEq/L (ref 3.5–5.1)
Sodium: 140 mEq/L (ref 135–145)
Total Bilirubin: 0.8 mg/dL (ref 0.2–1.2)
Total Protein: 7.4 g/dL (ref 6.0–8.3)

## 2021-07-30 LAB — TSH: TSH: 0.44 u[IU]/mL (ref 0.35–5.50)

## 2021-07-30 LAB — LIPID PANEL
Cholesterol: 199 mg/dL (ref 0–200)
HDL: 61.5 mg/dL (ref >39.00)
LDL Cholesterol: 119 mg/dL — ABNORMAL HIGH (ref 0–99)
NonHDL: 137.54
Total CHOL/HDL Ratio: 3
Triglycerides: 95 mg/dL (ref 0.0–149.0)
VLDL: 19 mg/dL (ref 0.0–40.0)

## 2021-07-30 LAB — HEMOGLOBIN A1C: Hgb A1c MFr Bld: 6.1 % (ref 4.6–6.5)

## 2021-08-23 ENCOUNTER — Other Ambulatory Visit: Payer: Self-pay | Admitting: Internal Medicine

## 2021-09-11 ENCOUNTER — Other Ambulatory Visit: Payer: Self-pay | Admitting: Internal Medicine

## 2021-09-11 ENCOUNTER — Ambulatory Visit (INDEPENDENT_AMBULATORY_CARE_PROVIDER_SITE_OTHER): Payer: Medicare HMO | Admitting: Internal Medicine

## 2021-09-11 ENCOUNTER — Ambulatory Visit
Admission: RE | Admit: 2021-09-11 | Discharge: 2021-09-11 | Disposition: A | Discharge status: Home / Self Care | Payer: Medicare HMO | Source: Ambulatory Visit | Attending: Internal Medicine | Admitting: Internal Medicine

## 2021-09-11 ENCOUNTER — Other Ambulatory Visit: Payer: Self-pay

## 2021-09-11 ENCOUNTER — Encounter: Payer: Self-pay | Admitting: Internal Medicine

## 2021-09-11 VITALS — BP 114/72 | HR 71 | Temp 98.2°F | Ht 62.0 in | Wt 132.8 lb

## 2021-09-11 DIAGNOSIS — E034 Atrophy of thyroid (acquired): Secondary | ICD-10-CM

## 2021-09-11 DIAGNOSIS — R52 Pain, unspecified: Secondary | ICD-10-CM

## 2021-09-11 DIAGNOSIS — M25552 Pain in left hip: Secondary | ICD-10-CM | POA: Diagnosis present

## 2021-09-11 DIAGNOSIS — M25551 Pain in right hip: Secondary | ICD-10-CM

## 2021-09-11 DIAGNOSIS — Z23 Encounter for immunization: Secondary | ICD-10-CM

## 2021-09-11 LAB — SEDIMENTATION RATE: Sed Rate: 21 mm/hr (ref 0–30)

## 2021-09-11 LAB — TSH: TSH: 0.22 u[IU]/mL — ABNORMAL LOW (ref 0.35–5.50)

## 2021-09-11 MED ORDER — TRAMADOL HCL 50 MG PO TABS: 50.0000 mg | ORAL_TABLET | Freq: Four times a day (QID) | ORAL | 0 refills | Status: DC | PRN

## 2021-09-11 NOTE — Progress Notes (Signed)
Subjective:  Patient ID: Krista Olson, female    DOB: [birth year]  Age: 65 y.o. MRN: [id number]  CC: The primary encounter diagnosis was Need for immunization against influenza. Diagnoses of Bilateral hip pain and Hypothyroidism due to acquired atrophy of thyroid were also pertinent to this visit.  HPI [name] presents for evaluation of right sided hip pain  Chief Complaint  Patient presents with   bi-lateral hip pain    Worse on right side    This visit occurred during the SARS-CoV-2 public health emergency.  Safety protocols were in place, including screening questions prior to the visit, additional usage of staff PPE, and extensive cleaning of exam room while observing appropriate contact time as indicated for disinfecting solutions.    Bilateral hip pain  moderate 7-8 on pain scale today mostly occurring In the right hip. .   For the past year,  the pain has been  intermittent  and bilateral but worse on the right.  Woke up today with moderate pain on the right.  Sleeps on her side alternates sides.   Some chronic lbp but no radiation and sees chiropractor.  Does yardwork and yoga on a regular basis.  Usually doesn't hurt during yoga but today it hurt  Taking 250 mg tylenol prn , does not help.   Avoids full strength tylenol due to autoimmune hepatitis   Nighttime tremors of hands,  very faint.  Also feels like it affects her head,  no rapid heart rate,  no presyncope.  No balance problems.   Outpatient Medications Prior to Visit  Medication Sig Dispense Refill   ascorbic acid (VITAMIN C) 250 MG CHEW Chew by mouth.     atorvastatin (LIPITOR) 10 MG tablet TAKE 1 TABLET BY MOUTH AT BEDTIME 90 tablet 1   azaTHIOprine (IMURAN) 50 MG tablet Take 1 tablet (50 mg total) by mouth daily. 30 tablet 5   Calcium Carbonate-Vitamin D (CALCIUM + D PO) Take 1 tablet by mouth daily.     fish oil-omega-3 fatty acids 1000 MG capsule Take 1 capsule by mouth daily.     glucosamine-chondroitin  500-400 MG tablet Take 1 tablet by mouth 3 (three) times daily.     levofloxacin (LEVAQUIN) 500 MG tablet Take 1 tablet (500 mg total) by mouth daily. 7 tablet 0   levothyroxine (SYNTHROID) 75 MCG tablet TAKE 1 TABLET BY MOUTH ONCE DAILY ON AN EMPTY STOMACH. WAIT 30 MINUTES BEFORE TAKING OTHER MEDS. 90 tablet 1   ondansetron (ZOFRAN) 4 MG tablet Take 1 tablet (4 mg total) by mouth every 8 (eight) hours as needed for nausea or vomiting. (Patient not taking: Reported on 09/11/2021) 20 tablet 0   ALPRAZolam (XANAX) 0.25 MG tablet Take 1 tablet (0.25 mg total) by mouth 2 (two) times daily as needed (fear of flying). (Patient not taking: Reported on 09/11/2021) 10 tablet 0   predniSONE (DELTASONE) 10 MG tablet 6 tablets daily for 2 days , then reduce by 1 tablet every 2 days  until gone (Patient not taking: Reported on 09/11/2021) 42 tablet 0   No facility-administered medications prior to visit.    Review of Systems;  Patient denies headache, fevers, malaise, unintentional weight loss, skin rash, eye pain, sinus congestion and sinus pain, sore throat, dysphagia,  hemoptysis , cough, dyspnea, wheezing, chest pain, palpitations, orthopnea, edema, abdominal pain, nausea, melena, diarrhea, constipation, flank pain, dysuria, hematuria, urinary  Frequency, nocturia, numbness, tingling, seizures,  Focal weakness, Loss of consciousness,  Tremor, insomnia,  depression, anxiety, and suicidal ideation.      Objective:  BP 114/72 (BP Location: Left Arm, Patient Position: Sitting, Cuff Size: Normal)   Pulse 71   Temp 98.2 F (36.8 C) (Oral)   Ht 5\' 2"  (1.575 m)   Wt 132 lb 12.8 oz (60.2 kg)   SpO2 97%   BMI 24.29 kg/m   BP Readings from Last 3 Encounters:  09/11/21 114/72  07/14/21 (!) 148/83  09/10/20 110/68    Wt Readings from Last 3 Encounters:  09/11/21 132 lb 12.8 oz (60.2 kg)  02/24/21 130 lb (59 kg)  12/31/20 130 lb (59 kg)    General appearance: alert, cooperative and appears stated  age Ears: normal TM's and external ear canals both ears Throat: lips, mucosa, and tongue normal; teeth and gums normal Neck: no adenopathy, no carotid bruit, supple, symmetrical, trachea midline and thyroid not enlarged, symmetric, no tenderness/mass/nodules Back: symmetric, no curvature. ROM normal. No CVA tenderness. Lungs: clear to auscultation bilaterally Heart: regular rate and rhythm, S1, S2 normal, no murmur, click, rub or gallop Abdomen: soft, non-tender; bowel sounds normal; no masses,  no organomegaly Pulses: 2+ and symmetric Skin: Skin color, texture, turgor normal. No rashes or lesions Lymph nodes: Cervical, supraclavicular, and axillary nodes normal. MSK:  ROM not restricted,  has inguinal  pain with interanal and external rotation a/w/a  with flexion at knee   Lab Results  Component Value Date   HGBA1C 6.1 07/30/2021   HGBA1C 6.0 02/04/2021   HGBA1C 6.3 09/04/2020    Lab Results  Component Value Date   CREATININE 0.65 07/30/2021   CREATININE 0.59 02/04/2021   CREATININE 0.67 09/04/2020    Lab Results  Component Value Date   WBC 5.6 07/30/2021   HGB 14.3 07/30/2021   HCT 43.2 07/30/2021   PLT 240.0 07/30/2021   GLUCOSE 98 07/30/2021   CHOL 199 07/30/2021   TRIG 95.0 07/30/2021   HDL 61.50 07/30/2021   LDLDIRECT 127.0 11/30/2018   LDLCALC 119 (H) 07/30/2021   ALT 13 07/30/2021   AST 17 07/30/2021   NA 140 07/30/2021   K 4.6 07/30/2021   CL 103 07/30/2021   CREATININE 0.65 07/30/2021   BUN 11 07/30/2021   CO2 27 07/30/2021   TSH 0.22 (L) 09/11/2021   HGBA1C 6.1 07/30/2021    No results found.  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 But there is no evidence on  plain films.  Pain control is problematic as she has autoimmune hepatitis.  Trial of tramadol was stopped after one dose due to dizziness,,  will try fioricet . Source of pain may be coming from lumbar spine .  PT eval recommended.       Relevant Orders   Sedimentation rate (Completed)   DG Lumbar Spine Complete   Other Visit Diagnoses     Need for immunization against influenza    -  Primary   Relevant Orders   Flu Vaccine QUAD High Dose(Fluad) (Completed)   MM 3D  SCREEN BREAST BILATERAL      I provided  30 minutes  during this encounter reviewing patient's current problems and past surgeries, labs and imaging studies, providing counseling on the above mentioned problems I na face to face visit  , and coordination  of care .  Meds ordered this encounter  Medications   DISCONTD: traMADol (ULTRAM) 50 MG tablet    Sig: Take 1 tablet (50 mg total) by mouth every 6 (six) hours as needed.    Dispense:  28 tablet    Refill:  0   levothyroxine (SYNTHROID) 50 MCG tablet    Sig: Take 1 tablet (50 mcg total) by mouth daily before breakfast.    Dispense:  90 tablet    Refill:  1    Medications Discontinued During This Encounter  Medication Reason   levofloxacin (LEVAQUIN) 500 MG tablet    traMADol (ULTRAM) 50 MG tablet    ALPRAZolam (XANAX) 0.25 MG tablet    predniSONE (DELTASONE) 10 MG tablet    levothyroxine (SYNTHROID) 75 MCG tablet     Follow-up: No follow-ups on file.   Crecencio Mc, MD

## 2021-09-11 NOTE — Patient Instructions (Signed)
Plain films today of both hips to evaluate joint space   Repeating thyroid test given new tremor  Tramadol can be taken every 6 hours if needed for pain control  PT vs Orthopedics referral depending on results of x ray

## 2021-09-12 ENCOUNTER — Telehealth: Payer: Self-pay | Admitting: Internal Medicine

## 2021-09-12 MED ORDER — BUTALBITAL-APAP-CAFFEINE 50-325-40 MG PO TABS: 1.0000 | ORAL_TABLET | Freq: Four times a day (QID) | ORAL | 0 refills | Status: AC | PRN

## 2021-09-12 NOTE — Addendum Note (Signed)
Addended by: Crecencio Mc on: 09/12/2021 01:43 PM   Modules accepted: Orders

## 2021-09-12 NOTE — Telephone Encounter (Signed)
Patient stated she is ok to try the fioricet.

## 2021-09-12 NOTE — Telephone Encounter (Signed)
There is very little else I can offer for pain control.  Everything else is stronger than tramadol and contains a narcotic  If she would like to try fioricet,  it contains butalbital which is milder,  and tylenol. It's the only other option   Let me know

## 2021-09-12 NOTE — Telephone Encounter (Signed)
Patient was prescribed traMADol (ULTRAM) 50 MG tablet yesterday by Dr Derrel Nip. She is having side effects and would like a call back from the office to discuss her next option. Please call her at [phone].

## 2021-09-12 NOTE — Telephone Encounter (Signed)
Fioricet sent.

## 2021-09-12 NOTE — Telephone Encounter (Signed)
Spoken to patient she stated  she was feeling light headedness afterward but since she hasn't taken anymore the sx have since disappeared. No other sx. She would like a different medication if possible for her pain.

## 2021-09-13 DIAGNOSIS — M25551 Pain in right hip: Secondary | ICD-10-CM | POA: Insufficient documentation

## 2021-09-13 DIAGNOSIS — M25552 Pain in left hip: Secondary | ICD-10-CM | POA: Insufficient documentation

## 2021-09-13 MED ORDER — LEVOTHYROXINE SODIUM 50 MCG PO TABS: 50.0000 ug | ORAL_TABLET | Freq: Every day | ORAL | 1 refills | Status: DC

## 2021-09-13 NOTE — Assessment & Plan Note (Signed)
Thyroid function is overactive on current dose of levothyroxine   Lab Results  Component Value Date   TSH 0.22 (L) 09/11/2021

## 2021-09-13 NOTE — Assessment & Plan Note (Addendum)
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 But there is no evidence on  plain films.  Pain control is problematic as she has autoimmune hepatitis.  Trial of tramadol was stopped after one dose due to dizziness,,  will try fioricet . Source of pain may be coming from lumbar spine .  PT eval recommended.

## 2021-09-30 ENCOUNTER — Telehealth: Payer: Self-pay

## 2021-09-30 NOTE — Telephone Encounter (Signed)
error 

## 2021-10-15 ENCOUNTER — Ambulatory Visit
Admission: RE | Admit: 2021-10-15 | Discharge: 2021-10-15 | Disposition: A | Discharge status: Home / Self Care | Payer: Medicare HMO | Source: Ambulatory Visit | Attending: Internal Medicine | Admitting: Internal Medicine

## 2021-10-15 ENCOUNTER — Other Ambulatory Visit: Payer: Self-pay

## 2021-10-15 DIAGNOSIS — Z23 Encounter for immunization: Secondary | ICD-10-CM

## 2021-10-15 DIAGNOSIS — Z1231 Encounter for screening mammogram for malignant neoplasm of breast: Secondary | ICD-10-CM | POA: Insufficient documentation

## 2021-11-11 ENCOUNTER — Telehealth (INDEPENDENT_AMBULATORY_CARE_PROVIDER_SITE_OTHER): Payer: Medicare HMO | Admitting: Family Medicine

## 2021-11-11 ENCOUNTER — Other Ambulatory Visit: Payer: Self-pay

## 2021-11-11 ENCOUNTER — Encounter: Payer: Self-pay | Admitting: Family Medicine

## 2021-11-11 DIAGNOSIS — J069 Acute upper respiratory infection, unspecified: Secondary | ICD-10-CM | POA: Diagnosis not present

## 2021-11-11 MED ORDER — PREDNISONE 10 MG PO TABS
ORAL_TABLET | ORAL | 0 refills | Status: AC
Start: 2021-11-11 — End: 2021-11-17

## 2021-11-11 NOTE — Assessment & Plan Note (Signed)
Patient likely has a viral upper respiratory infection.  I have asked that she test herself for COVID at home.  Discussed that this would not change medication management though would dictate how long she has to stay quarantined for. She will let us know if she tests positive.  We will treat her with a prednisone taper as that has been beneficial in the past for this type of illness.  Discussed if she is not improving over the next 2 to 3 days I would consider antibiotics at that point.  She will contact us if she is not improving.

## 2021-11-11 NOTE — Progress Notes (Signed)
Virtual Visit via telephone Note  This visit type was conducted due to national recommendations for restrictions regarding the COVID-19 pandemic (e.g. social distancing).  This format is felt to be most appropriate for this patient at this time.  All issues noted in this document were discussed and addressed.  No physical exam was performed (except for noted visual exam findings with Video Visits).   I connected with Krista Olson today at  9:00 AM EST by telephone and verified that I am speaking with the correct person using two identifiers. Location patient: home Location provider: work  Persons participating in the virtual visit: patient, provider  I discussed the limitations, risks, security and privacy concerns of performing an evaluation and management service by telephone and the availability of in person appointments. I also discussed with the patient that there may be a patient responsible charge related to this service. The patient expressed understanding and agreed to proceed.  Interactive audio and video telecommunications were attempted between this provider and patient, however failed, due to patient having technical difficulties OR patient did not have access to video capability.  We continued and completed visit with audio only.   Reason for visit: same day visit  HPI: Upper respiratory infection: Patient notes onset of symptoms on 11/06/2021.  It started with nasal congestion and some mild postnasal drip.  She had mild cough and sore throat.  She now has nasal and sinus congestion with some drainage.  No shortness of breath, fever, loss of taste or smell, COVID exposure, or flu exposure.  She notes Sudafed yesterday was helpful.  Has been taking NyQuil at night.  She has received 4 COVID vaccines.  She has an autoimmune disorder and notes typically her body will just have trouble fighting this kind of illness.  In the past she has responded well to prednisone.  She has not tested  herself for COVID.   ROS: See pertinent positives and negatives per HPI.  Past Medical History:  Diagnosis Date   Hepatitis, autoimmune ([place])    Thyroid disease     Past Surgical History:  Procedure Laterality Date   PERCUTANEOUS LIVER BIOPSY      Family History  Problem Relation Age of Onset   Goiter Mother    Heart disease Mother 45       died during valve replacement   Kidney disease Father    Cancer Brother 72       renal cell Ca   Heart disease Brother        CARDIAC AREEST DURING SURGERY    Cancer Maternal Aunt 42       breast cancer , 1of 7 sisters only   Breast cancer Maternal Aunt 53    SOCIAL HX: Non-smoker   Current Outpatient Medications:    ascorbic acid (VITAMIN C) 250 MG CHEW, Chew by mouth., Disp: , Rfl:    atorvastatin (LIPITOR) 10 MG tablet, TAKE 1 TABLET BY MOUTH AT BEDTIME, Disp: 90 tablet, Rfl: 1   azaTHIOprine (IMURAN) 50 MG tablet, Take 1 tablet (50 mg total) by mouth daily., Disp: 30 tablet, Rfl: 5   Calcium Carbonate-Vitamin D (CALCIUM + D PO), Take 1 tablet by mouth daily., Disp: , Rfl:    fish oil-omega-3 fatty acids 1000 MG capsule, Take 1 capsule by mouth daily., Disp: , Rfl:    glucosamine-chondroitin 500-400 MG tablet, Take 1 tablet by mouth 3 (three) times daily., Disp: , Rfl:    levothyroxine (SYNTHROID) 50 MCG tablet, Take 1  tablet (50 mcg total) by mouth daily before breakfast., Disp: 90 tablet, Rfl: 1   predniSONE (DELTASONE) 10 MG tablet, Take 6 tablets (60 mg total) by mouth daily with breakfast for 1 day, THEN 5 tablets (50 mg total) daily with breakfast for 1 day, THEN 4 tablets (40 mg total) daily with breakfast for 1 day, THEN 3 tablets (30 mg total) daily with breakfast for 1 day, THEN 2 tablets (20 mg total) daily with breakfast for 1 day, THEN 1 tablet (10 mg total) daily with breakfast for 1 day., Disp: 21 tablet, Rfl: 0   ondansetron (ZOFRAN) 4 MG tablet, Take 1 tablet (4 mg total) by mouth every 8 (eight) hours as needed for  nausea or vomiting. (Patient not taking: Reported on 11/11/2021), Disp: 20 tablet, Rfl: 0  EXAM: This was a telephone visit and thus no exam was completed.  ASSESSMENT AND PLAN:  Discussed the following assessment and plan:  Problem List Items Addressed This Visit     Upper respiratory infection    Patient likely has a viral upper respiratory infection.  I have asked that she test herself for COVID at home.  Discussed that this would not change medication management though would dictate how long she has to stay quarantined for. She will let us know if she tests positive.  We will treat her with a prednisone taper as that has been beneficial in the past for this type of illness.  Discussed if she is not improving over the next 2 to 3 days I would consider antibiotics at that point.  She will contact us if she is not improving.      Relevant Medications   predniSONE (DELTASONE) 10 MG tablet    Return if symptoms worsen or fail to improve.   I discussed the assessment and treatment plan with the patient. The patient was provided an opportunity to ask questions and all were answered. The patient agreed with the plan and demonstrated an understanding of the instructions.   The patient was advised to call back or seek an in-person evaluation if the symptoms worsen or if the condition fails to improve as anticipated.  I provided 8 minutes of non-face-to-face time during this encounter.   Tommi Rumps, MD

## 2021-11-30 ENCOUNTER — Encounter: Payer: Self-pay | Admitting: Internal Medicine

## 2021-12-01 ENCOUNTER — Ambulatory Visit: Payer: Medicare HMO | Admitting: Dermatology

## 2021-12-01 ENCOUNTER — Other Ambulatory Visit: Payer: Self-pay

## 2021-12-01 DIAGNOSIS — L858 Other specified epidermal thickening: Secondary | ICD-10-CM

## 2021-12-01 DIAGNOSIS — D1800 Hemangioma unspecified site: Secondary | ICD-10-CM

## 2021-12-01 DIAGNOSIS — L578 Other skin changes due to chronic exposure to nonionizing radiation: Secondary | ICD-10-CM

## 2021-12-01 DIAGNOSIS — D2262 Melanocytic nevi of left upper limb, including shoulder: Secondary | ICD-10-CM | POA: Diagnosis not present

## 2021-12-01 DIAGNOSIS — D2372 Other benign neoplasm of skin of left lower limb, including hip: Secondary | ICD-10-CM

## 2021-12-01 DIAGNOSIS — L814 Other melanin hyperpigmentation: Secondary | ICD-10-CM

## 2021-12-01 DIAGNOSIS — D2271 Melanocytic nevi of right lower limb, including hip: Secondary | ICD-10-CM | POA: Diagnosis not present

## 2021-12-01 DIAGNOSIS — Z1283 Encounter for screening for malignant neoplasm of skin: Secondary | ICD-10-CM

## 2021-12-01 DIAGNOSIS — L818 Other specified disorders of pigmentation: Secondary | ICD-10-CM | POA: Diagnosis not present

## 2021-12-01 DIAGNOSIS — L719 Rosacea, unspecified: Secondary | ICD-10-CM

## 2021-12-01 DIAGNOSIS — L821 Other seborrheic keratosis: Secondary | ICD-10-CM

## 2021-12-01 DIAGNOSIS — D225 Melanocytic nevi of trunk: Secondary | ICD-10-CM

## 2021-12-01 DIAGNOSIS — D229 Melanocytic nevi, unspecified: Secondary | ICD-10-CM

## 2021-12-01 MED ORDER — TRETINOIN 0.05 % EX CREA: TOPICAL_CREAM | Freq: Every day | CUTANEOUS | 5 refills | Status: DC

## 2021-12-01 MED ORDER — AZELAIC ACID 15 % EX GEL: CUTANEOUS | 5 refills | Status: DC

## 2021-12-01 NOTE — Patient Instructions (Addendum)
CeraVe Vitamin C Serum with Hyaluronic Acid - apply to face in the morning  Topical retinoid medications like tretinoin/Retin-A, adapalene/Differin, tazarotene/Fabior, and Epiduo/Epiduo Forte can cause dryness and irritation when first started. Only apply a pea-sized amount to the entire affected area. Avoid applying it around the eyes, edges of mouth and creases at the nose. If you experience irritation, use a good moisturizer first and/or apply the medicine less often. If you are doing well with the medicine, you can increase how often you use it until you are applying every night. Be careful with sun protection while using this medication as it can make you sensitive to the sun. This medicine should not be used by pregnant women.    Rosacea  What is rosacea? Rosacea (say: ro-zay-sha) is a common skin disease that usually begins as a trend of flushing or blushing easily.  As rosacea progresses, a persistent redness in the center of the face will develop and may gradually spread beyond the nose and cheeks to the forehead and chin.  In some cases, the ears, chest, and back could be affected.  Rosacea may appear as tiny blood vessels or small red bumps that occur in crops.  Frequently they can contain pus, and are called pustules.  If the bumps do not contain pus, they are referred to as papules.  Rarely, in prolonged, untreated cases of rosacea, the oil glands of the nose and cheeks may become permanently enlarged.  This is called rhinophyma, and is seen more frequently in men.  Signs and Risks In its beginning stages, rosacea tends to come and go, which makes it difficult to recognize.  It can start as intermittent flushing of the face.  Eventually, blood vessels may become permanently visible.  Pustules and papules can appear, but can be mistaken for adult acne.  People of all races, ages, genders and ethnic groups are at risk of developing rosacea.  However, it is more common in women (especially  around menopause) and adults with fair skin between the ages of 65 and 11.  Treatment Dermatologists typically recommend a combination of treatments to effectively manage rosacea.  Treatment can improve symptoms and may stop the progression of the rosacea.  Treatment may involve both topical and oral medications.  The tetracycline antibiotics are often used for their anti-inflammatory effect; however, because of the possibility of developing antibiotic resistance, they should not be used long term at full dose.  For dilated blood vessels the options include electrodessication (uses electric current through a small needle), laser treatment, and cosmetics to hide the redness.   With all forms of treatment, improvement is a slow process, and patients may not see any results for the first 3-4 weeks.  It is very important to avoid the sun and other triggers.  Patients must wear sunscreen daily.  Skin Care Instructions: Cleanse the skin with a mild soap such as CeraVe cleanser, Cetaphil cleanser, or Dove soap once or twice daily as needed. Moisturize with Eucerin Redness Relief Daily Perfecting Lotion (has a subtle green tint), CeraVe Moisturizing Cream, or Oil of Olay Daily Moisturizer with sunscreen every morning and/or night as recommended. Makeup should be non-comedogenic (wont clog pores) and be labeled for sensitive skin. Good choices for cosmetics are: Neutrogena, Almay, and Physicians Formula.  Any product with a green tint tends to offset a red complexion. If your eyes are dry and irritated, use artificial tears 2-3 times per day and cleanse the eyelids daily with baby shampoo.  Have your  eyes examined at least every 2 years.  Be sure to tell your eye doctor that you have rosacea. Alcoholic beverages tend to cause flushing of the skin, and may make rosacea worse. Always wear sunscreen, protect your skin from extreme hot and cold temperatures, and avoid spicy foods, hot drinks, and mechanical  irritation such as rubbing, scrubbing, or massaging the face.  Avoid harsh skin cleansers, cleansing masks, astringents, and exfoliation. If a particular product burns or makes your face feel tight, then it is likely to flare your rosacea. If you are having difficulty finding a sunscreen that you can tolerate, you may try switching to a chemical-free sunscreen.  These are ones whose active ingredient is zinc oxide or titanium dioxide only.  They should also be fragrance free, non-comedogenic, and labeled for sensitive skin. Rosacea triggers may vary from person to person.  There are a variety of foods that have been reported to trigger rosacea.  Some patients find that keeping a diary of what they were doing when they flared helps them avoid triggers.    Melanoma ABCDEs  Melanoma is the most dangerous type of skin cancer, and is the leading cause of death from skin disease.  You are more likely to develop melanoma if you: Have light-colored skin, light-colored eyes, or red or blond hair Spend a lot of time in the sun Tan regularly, either outdoors or in a tanning bed Have had blistering sunburns, especially during childhood Have a close family member who has had a melanoma Have atypical moles or large birthmarks  Early detection of melanoma is key since treatment is typically straightforward and cure rates are extremely high if we catch it early.   The first sign of melanoma is often a change in a mole or a new dark spot.  The ABCDE system is a way of remembering the signs of melanoma.  A for asymmetry:  The two halves do not match. B for border:  The edges of the growth are irregular. C for color:  A mixture of colors are present instead of an even brown color. D for diameter:  Melanomas are usually (but not always) greater than 49mm - the size of a pencil eraser. E for evolution:  The spot keeps changing in size, shape, and color.  Please check your skin once per month between visits. You  can use a small mirror in front and a large mirror behind you to keep an eye on the back side or your body.   If you see any new or changing lesions before your next follow-up, please call to schedule a visit.  Please continue daily skin protection including broad spectrum sunscreen SPF 30+ to sun-exposed areas, reapplying every 2 hours as needed when you're outdoors.   Staying in the shade or wearing long sleeves, sun glasses (UVA+UVB protection) and wide brim hats (4-inch brim around the entire circumference of the hat) are also recommended for sun protection.     If You Need Anything After Your Visit  If you have any questions or concerns for your doctor, please call our main line at [phone] and press option 4 to reach your doctor's medical assistant. If no one answers, please leave a voicemail as directed and we will return your call as soon as possible. Messages left after 4 pm will be answered the following business day.   You may also send [place] a message via [place]. We typically respond to MyChart messages within 1-2 business days.  For prescription  refills, please ask your pharmacy to contact our office. Our fax number is [phone].  If you have an urgent issue when the clinic is closed that cannot wait until the next business day, you can page your doctor at the number below.    Please note that while we do our best to be available for urgent issues outside of office hours, we are not available 24/7.   If you have an urgent issue and are unable to reach [place], you may choose to seek medical care at your doctor's office, retail clinic, urgent care center, or emergency room.  If you have a medical emergency, please immediately call 911 or go to the emergency department.  Pager Numbers  - Dr. Nehemiah Massed: [phone]  - Dr. Laurence Ferrari: [phone]  - Dr. Nicole Kindred: [phone]  In the event of inclement weather, please call our main line at [phone] for an update on the status  of any delays or closures.  Dermatology Medication Tips: Please keep the boxes that topical medications come in in order to help keep track of the instructions about where and how to use these. Pharmacies typically print the medication instructions only on the boxes and not directly on the medication tubes.   If your medication is too expensive, please contact our office at [phone] option 4 or send [place] a message through [place].   We are unable to tell what your co-pay for medications will be in advance as this is different depending on your insurance coverage. However, we may be able to find a substitute medication at lower cost or fill out paperwork to get insurance to cover a needed medication.   If a prior authorization is required to get your medication covered by your insurance company, please allow [place] 1-2 business days to complete this process.  Drug prices often vary depending on where the prescription is filled and some pharmacies may offer cheaper prices.  The website www.goodrx.com contains coupons for medications through different pharmacies. The prices here do not account for what the cost may be with help from insurance (it may be cheaper with your insurance), but the website can give you the price if you did not use any insurance.  - You can print the associated coupon and take it with your prescription to the pharmacy.  - You may also stop by our office during regular business hours and pick up a GoodRx coupon card.  - If you need your prescription sent electronically to a different pharmacy, notify our office through [facility] or by phone at [phone] option 4.     Si Usted Necesita Algo Despus de Su Visita  Tambin puede enviarnos un mensaje a travs de Pharmacist, community. Por lo general respondemos a los mensajes de MyChart en el transcurso de 1 a 2 das hbiles.  Para renovar recetas, por favor pida a su farmacia que se ponga en contacto con nuestra oficina.  Harland Dingwall de fax es [place] [phone].  Si tiene un asunto urgente cuando la clnica est cerrada y que no puede esperar hasta el siguiente da hbil, puede llamar/localizar a su doctor(a) al nmero que aparece a continuacin.   Por favor, tenga en cuenta que aunque hacemos todo lo posible para estar disponibles para asuntos urgentes fuera del horario de [place], no estamos disponibles las 24 horas del da, los 7 das de la [place].   Si tiene un problema urgente y no puede comunicarse con nosotros, puede optar por buscar atencin Warden/ranger  de su doctor(a), en una clnica privada, en un centro de atencin urgente o en una sala de emergencias.  Si tiene Engineering geologist, por favor llame inmediatamente al 911 o vaya a la sala de emergencias.  Nmeros de bper  - Dr. Nehemiah Massed: [phone]  - Dra. Moye: [phone]  - Dra. Nicole Kindred: [phone]  En caso de inclemencias del [address], por favor llame a Johnsie Kindred principal al [phone] para una actualizacin sobre el [place] de cualquier retraso o cierre.  Consejos para la medicacin en dermatologa: Por favor, guarde las cajas en las que vienen los medicamentos de uso tpico para ayudarle a seguir las instrucciones sobre dnde y cmo usarlos. Las farmacias generalmente imprimen las instrucciones del medicamento slo en las cajas y no directamente en los tubos del [place].   Si su medicamento es muy caro, por favor, pngase en contacto con Zigmund Daniel llamando al [phone] y presione la opcin 4 o envenos un mensaje a travs de Pharmacist, community.   No podemos decirle cul ser su copago por los medicamentos por adelantado ya que esto es diferente dependiendo de la cobertura de su seguro. Sin embargo, es posible que podamos encontrar un medicamento sustituto a Electrical engineer un formulario para que el seguro cubra el medicamento que se considera necesario.   Si se requiere una autorizacin previa para que su  compaa de seguros [place] su medicamento, por favor permtanos de 1 a 2 das hbiles para completar este proceso.  Los precios de los medicamentos varan con frecuencia dependiendo del Environmental consultant de dnde se surte la receta y alguna farmacias pueden ofrecer precios ms baratos.  El sitio web www.goodrx.com tiene cupones para medicamentos de Airline pilot. Los precios aqu no tienen en cuenta lo que podra costar con la ayuda del seguro (puede ser ms barato con su seguro), pero el sitio web puede darle el precio si no utiliz Research scientist (physical sciences).  - Puede imprimir el cupn correspondiente y llevarlo con su receta a la farmacia.  - Tambin puede pasar por nuestra oficina durante el horario de atencin regular y Charity fundraiser una tarjeta de cupones de GoodRx.  - Si necesita que su receta se enve electrnicamente a una farmacia diferente, informe a nuestra oficina a travs de MyChart de [place] o por telfono llamando al [phone] y presione la opcin 4.

## 2021-12-01 NOTE — Progress Notes (Signed)
Follow-Up Visit   Subjective  Krista Olson is a 66 y.o. female who presents for the following: Annual Exam. Patient here for TBSE. The patient has spots, moles and lesions to be evaluated, some may be new or changing.  The following portions of the chart were reviewed this encounter and updated as appropriate:       Review of Systems:  No other skin or systemic complaints except as noted in HPI or Assessment and Plan.  Objective  Well appearing patient in no apparent distress; mood and affect are within normal limits.  A full examination was performed including scalp, head, eyes, ears, nose, lips, neck, chest, axillae, abdomen, back, buttocks, bilateral upper extremities, bilateral lower extremities, hands, feet, fingers, toes, fingernails, and toenails. All findings within normal limits unless otherwise noted below.  L upper arm above elbow, R post thigh, L lower back 4  mm medium brown papule at left upper arm above elbow   1.5 mm medium dark brown macule at right posterior thigh   1 mm med dark brown macule of the left lower back  arms, legs Small hypopigmented macules.  face Erythema of the nose and malar cheeks.    Assessment & Plan  Actinic Damage - chronic, secondary to cumulative UV radiation exposure/sun exposure over time - diffuse scaly erythematous macules with underlying dyspigmentation of the chest - Recommend daily broad spectrum sunscreen SPF 30+ to sun-exposed areas, reapply every 2 hours as needed.  - Recommend staying in the shade or wearing long sleeves, sun glasses (UVA+UVB protection) and wide brim hats (4-inch brim around the entire circumference of the hat). - Call for new or changing lesions.  Lentigines - Scattered tan macules - Due to sun exposure - Benign-appering, observe - Recommend daily broad spectrum sunscreen SPF 30+ to sun-exposed areas, reapply every 2 hours as needed. - Call for any changes  Seborrheic Keratoses - Stuck-on,  waxy, tan-brown papules and/or plaques, including left breast  - Benign-appearing - Discussed benign etiology and prognosis. - Observe - Call for any changes  Dermatofibroma - Firm pink/brown papulenodule with dimple sign of the right lateral knee - Benign appearing - Call for any changes  Hemangiomas - Red papules - Discussed benign nature - Observe - Call for any changes  Keratosis Pilaris - Tiny follicular keratotic papules - Benign. Genetic in nature. No cure. - Observe. - If desired, patient can use an emollient (moisturizer) containing ammonium lactate, urea or salicylic acid once a day to smooth the area  Nevus L upper arm above elbow, R post thigh, L lower back  Benign-appearing.  Stable. Observation.  Call clinic for new or changing moles.  Recommend daily use of broad spectrum spf 30+ sunscreen to sun-exposed areas.   Idiopathic guttate hypomelanosis arms, legs  Benign, observe.   Recommend daily broad spectrum sunscreen SPF 30+ to sun-exposed areas, reapply every 2 hours as needed. Call for new or changing lesions.  Staying in the shade or wearing long sleeves, sun glasses (UVA+UVB protection) and wide brim hats (4-inch brim around the entire circumference of the hat) are also recommended for sun protection.    Rosacea face  Rosacea is a chronic progressive skin condition usually affecting the face of adults, causing redness and/or acne bumps. It is treatable but not curable. It sometimes affects the eyes (ocular rosacea) as well. It may respond to topical and/or systemic medication and can flare with stress, sun exposure, alcohol, exercise and some foods.  Daily application of broad  spectrum spf 30+ sunscreen to face is recommended to reduce flares. Discussed laser treatments to help reduce redness.   Start Finacea Gel Apply to face BID for rosacea dsp 50g 5Rf.   Start tretinoin 0.05% cream Apply a pea-sized amount to face qhs as tolerated dsp 45g  5Rf.   Topical retinoid medications like tretinoin/Retin-A, adapalene/Differin, tazarotene/Fabior, and Epiduo/Epiduo Forte can cause dryness and irritation when first started. Only apply a pea-sized amount to the entire affected area. Avoid applying it around the eyes, edges of mouth and creases at the nose. If you experience irritation, use a good moisturizer first and/or apply the medicine less often. If you are doing well with the medicine, you can increase how often you use it until you are applying every night. Be careful with sun protection while using this medication as it can make you sensitive to the sun. This medicine should not be used by pregnant women.     Azelaic Acid 15 % gel - face After skin is thoroughly washed and patted dry, gently but thoroughly massage a thin film of azelaic acid cream into the affected area twice daily, in the morning and evening.  tretinoin (RETIN-A) 0.05 % cream - face Apply topically at bedtime.   Return in about 1 year (around 12/01/2022) for TBSE.  IJamesetta Orleans, CMA, am acting as scribe for Brendolyn Patty, MD .  Documentation: I have reviewed the above documentation for accuracy and completeness, and I agree with the above.  Brendolyn Patty MD

## 2021-12-02 ENCOUNTER — Telehealth: Payer: Self-pay

## 2021-12-02 NOTE — Telephone Encounter (Signed)
Tretinoin was denied by pt insurance due to diagnosis code of being prescribed for rosacea. Please advise.

## 2021-12-15 ENCOUNTER — Other Ambulatory Visit: Payer: Self-pay | Admitting: Internal Medicine

## 2021-12-15 DIAGNOSIS — E034 Atrophy of thyroid (acquired): Secondary | ICD-10-CM

## 2022-01-29 ENCOUNTER — Telehealth: Payer: Self-pay

## 2022-01-29 DIAGNOSIS — K754 Autoimmune hepatitis: Secondary | ICD-10-CM

## 2022-01-29 NOTE — Telephone Encounter (Signed)
Received a fax from [facility] asking if we could draw some labs before her next appt with them. If okay I have pended the orders they just need to be signed off on.  ?

## 2022-01-29 NOTE — Telephone Encounter (Signed)
TAHNK YOU ? ?

## 2022-02-23 ENCOUNTER — Other Ambulatory Visit: Payer: Self-pay | Admitting: Internal Medicine

## 2022-02-23 ENCOUNTER — Telehealth: Payer: Self-pay | Admitting: Internal Medicine

## 2022-02-23 NOTE — Telephone Encounter (Signed)
Copied from [place] [phone]. Topic: Medicare AWV ?>> Feb 23, 2022  9:57 AM Harris-Coley, Hannah Beat wrote: ?Reason for CRM: Left message for patient to schedule Annual Wellness Visit.  Please schedule with Nurse Health Advisor Denisa O'Brien-Blaney, LPN at [facility].  Please call [phone] ask for Juliann Pulse ?

## 2022-03-09 ENCOUNTER — Ambulatory Visit (INDEPENDENT_AMBULATORY_CARE_PROVIDER_SITE_OTHER): Payer: Medicare HMO

## 2022-03-09 VITALS — Ht 62.0 in | Wt 130.0 lb

## 2022-03-09 DIAGNOSIS — Z780 Asymptomatic menopausal state: Secondary | ICD-10-CM

## 2022-03-09 DIAGNOSIS — Z0000 Encounter for general adult medical examination without abnormal findings: Secondary | ICD-10-CM | POA: Diagnosis not present

## 2022-03-09 NOTE — Patient Instructions (Addendum)
?Krista Olson , ?Thank you for taking time to come for your Medicare Wellness Visit. I appreciate your ongoing commitment to your health goals. Please review the following plan we discussed and let me know if I can assist you in the future.  ? ?These are the goals we discussed: ? Goals   ? ?  Increase physical activity   ? ?  ?  ?This is a list of the screening recommended for you and due dates:  ?Health Maintenance  ?Topic Date Due  ? DEXA scan (bone density measurement)  Never done  ? COVID-19 Vaccine (5 - Booster for Moderna series) 03/25/2022*  ? Pneumonia Vaccine (3) 05/22/2022*  ? Cologuard (Stool DNA test)  05/22/2022*  ? Flu Shot  06/23/2022  ? Mammogram  10/15/2022  ? Tetanus Vaccine  02/02/2024  ? Hepatitis C Screening: USPSTF Recommendation to screen - Ages 84-79 yo.  Completed  ? Zoster (Shingles) Vaccine  Completed  ? HPV Vaccine  Aged Out  ? Colon Cancer Screening  Discontinued  ?*Topic was postponed. The date shown is not the original due date.  ?  ?Bone Density Test ?A bone density test uses a type of X-ray to measure the amount of calcium and other minerals in a person's bones. It can measure bone density in the hip and the spine. The test is similar to having a regular X-ray. This test may also be called: ?Bone densitometry. ?Bone mineral density test. ?Dual-energy X-ray absorptiometry (DEXA). ?You may have this test to: ?Diagnose a condition that causes weak or thin bones (osteoporosis). ?Screen you for osteoporosis. ?Predict your risk for a broken bone (fracture). ?Determine how well your osteoporosis treatment is working. ?Tell a health care provider about: ?Any allergies you have. ?All medicines you are taking, including vitamins, herbs, eye drops, creams, and over-the-counter medicines. ?Any problems you or family members have had with anesthetic medicines. ?Any blood disorders you have. ?Any surgeries you have had. ?Any medical conditions you have. ?Whether you are pregnant or may be  pregnant. ?Any medical tests you have had within the past 14 days that used contrast material. ?What are the risks? ?Generally, this is a safe test. However, it does expose you to a small amount of radiation, which can slightly increase your cancer risk. ?What happens before the test? ?Do not take any calcium supplements within the 24 hours before your test. ?You will need to remove all metal jewelry, eyeglasses, removable dental appliances, and any other metal objects on your body. ?What happens during the test? ? ?You will lie down on an exam table. There will be an X-ray generator below you and an imaging device above you. ?Other devices, such as boxes or braces, may be used to position your body properly for the scan. ?The machine will slowly scan your body. You will need to keep very still while the machine does the scan. ?The images will show up on a screen in the room. Images will be examined by a specialist after your test is finished. ?The procedure may vary among health care providers and hospitals. ?What can I expect after the test? ?It is up to you to get the results of your test. Ask your health care provider, or the department that is doing the test, when your results will be ready. ?Summary ?A bone density test is an imaging test that uses a type of X-ray to measure the amount of calcium and other minerals in your bones. ?The test may be used to diagnose  or screen you for a condition that causes weak or thin bones (osteoporosis), predict your risk for a broken bone (fracture), or determine how well your osteoporosis treatment is working. ?Do not take any calcium supplements within 24 hours before your test. ?Ask your health care provider, or the department that is doing the test, when your results will be ready. ?This information is not intended to replace advice given to you by your health care provider. Make sure you discuss any questions you have with your health care provider. ?Document Revised:  07/23/2021 Document Reviewed: 04/25/2020 ?Elsevier Patient Education ? [employer]. ? ?

## 2022-03-09 NOTE — Progress Notes (Addendum)
Subjective:   Krista Olson is a 66 y.o. female who presents for an Initial Medicare Annual Wellness Visit.  Review of Systems    No ROS.  Medicare Wellness Virtual Visit.  Visual/audio telehealth visit, UTA vital signs.   See social history for additional risk factors.   Cardiac Risk Factors include: advanced age (>47men, >58 women)     Objective:    Today's Vitals   03/09/22 1332  Weight: 130 lb (59 kg)  Height: 5\' 2"  (1.575 m)   Body mass index is 23.78 kg/m.     03/09/2022    1:42 PM  Advanced Directives  Does Patient Have a Medical Advance Directive? Yes  Type of Estate agent of [place];Living will  Does patient want to make changes to medical advance directive? No - Patient declined  Copy of Healthcare Power of Attorney in Chart? No - copy requested    Current Medications (verified) Outpatient Encounter Medications as of 03/09/2022  Medication Sig   ascorbic acid (VITAMIN C) 250 MG CHEW Chew by mouth.   atorvastatin (LIPITOR) 10 MG tablet TAKE 1 TABLET BY MOUTH AT BEDTIME   azaTHIOprine (IMURAN) 50 MG tablet Take 1 tablet (50 mg total) by mouth daily.   Azelaic Acid 15 % gel After skin is thoroughly washed and patted dry, gently but thoroughly massage a thin film of azelaic acid cream into the affected area twice daily, in the morning and evening.   Calcium Carbonate-Vitamin D (CALCIUM + D PO) Take 1 tablet by mouth daily.   fish oil-omega-3 fatty acids 1000 MG capsule Take 1 capsule by mouth daily.   glucosamine-chondroitin 500-400 MG tablet Take 1 tablet by mouth 3 (three) times daily.   levothyroxine (SYNTHROID) 50 MCG tablet TAKE 1 TABLET BY MOUTH ONCE DAILY ON AN EMPTY STOMACH. WAIT 30 MINUTES BEFORE TAKING OTHER MEDS.   tretinoin (RETIN-A) 0.05 % cream Apply topically at bedtime.   No facility-administered encounter medications on file as of 03/09/2022.    Allergies (verified) Patient has no known allergies.   History: Past Medical  History:  Diagnosis Date   Hepatitis, autoimmune (HCC)    Thyroid disease    Past Surgical History:  Procedure Laterality Date   PERCUTANEOUS LIVER BIOPSY     Family History  Problem Relation Age of Onset   Goiter Mother    Heart disease Mother 72       died during valve replacement   Kidney disease Father    Cancer Brother 62       renal cell Ca   Heart disease Brother        CARDIAC AREEST DURING SURGERY    Cancer Maternal Aunt 13       breast cancer , 1of 7 sisters only   Breast cancer Maternal Aunt 73   Social History   Socioeconomic History   Marital status: Married    Spouse name: Not on file   Number of children: Not on file   Years of education: Not on file   Highest education level: Not on file  Occupational History   Not on file  Tobacco Use   Smoking status: Never   Smokeless tobacco: Never  Substance and Sexual Activity   Alcohol use: Yes    Alcohol/week: 5.0 standard drinks    Types: 5 Glasses of wine per week    Comment: occ   Drug use: No   Sexual activity: Not on file  Other Topics Concern   Not  on file  Social History Narrative   Not on file   Social Determinants of Health   Financial Resource Strain: Low Risk    Difficulty of Paying Living Expenses: Not hard at all  Food Insecurity: No Food Insecurity   Worried About Programme researcher, broadcasting/film/video in the Last Year: Never true   Barista in the Last Year: Never true  Transportation Needs: No Transportation Needs   Lack of Transportation (Medical): No   Lack of Transportation (Non-Medical): No  Physical Activity: Not on file  Stress: No Stress Concern Present   Feeling of Stress : Not at all  Social Connections: Unknown   Frequency of Communication with Friends and Family: Not on file   Frequency of Social Gatherings with Friends and Family: Not on file   Attends Religious Services: Not on file   Active Member of Clubs or Organizations: Not on file   Attends Banker Meetings:  Not on file   Marital Status: Married    Tobacco Counseling Counseling given: Not Answered   Clinical Intake:  Pre-visit preparation completed: Yes        Diabetes: No  How often do you need to have someone help you when you read instructions, pamphlets, or other written materials from your doctor or pharmacy?: 1 - Never    Interpreter Needed?: No      Activities of Daily Living    03/09/2022    1:34 PM  In your present state of health, do you have any difficulty performing the following activities:  Hearing? 0  Vision? 0  Difficulty concentrating or making decisions? 0  Walking or climbing stairs? 0  Dressing or bathing? 0  Doing errands, shopping? 0  Preparing Food and eating ? N  Using the Toilet? N  In the past six months, have you accidently leaked urine? N  Do you have problems with loss of bowel control? N  Managing your Medications? N  Managing your Finances? N  Housekeeping or managing your Housekeeping? N    Patient Care Team: Sherlene Shams, MD as PCP - General (Internal Medicine) Woodfin Ganja, MD as Referring Physician (Internal Medicine)  Indicate any recent Medical Services you may have received from other than [facility] providers in the past year (date may be approximate).     Assessment:   This is a routine wellness examination for Krista Olson.  Virtual Visit via Telephone Note  I connected with  FALICIA IMRIE on 03/09/22 at  1:30 PM EDT by telephone and verified that I am speaking with the correct person using two identifiers.  Persons participating in the virtual visit: patient/Nurse Health Advisor   I discussed the limitations of performing an evaluation and management service by telehealth. The patient expressed understanding and agreed to proceed. We continued and completed visit with audio only. Some vital signs may be absent or patient reported.   Hearing/Vision screen Hearing Screening - Comments:: Patient is able to hear conversational  tones without difficulty.  No issues reported. Vision Screening - Comments:: Wears corrective lenses They have seen their ophthalmologist in the last 12 months.    Dietary issues and exercise activities discussed: Current Exercise Habits: Home exercise routine, Type of exercise: yoga, Time (Minutes): 45, Frequency (Times/Week): 3, Weekly Exercise (Minutes/Week): 135, Intensity: Mild Healthy diet Good water intake   Goals Addressed             This Visit's Progress    Increase physical activity  Depression Screen    03/09/2022    1:42 PM 11/11/2021    8:52 AM 02/24/2021    4:34 PM 12/31/2020    3:23 PM 09/10/2020    2:00 PM 06/26/2019    3:50 PM 05/30/2018    3:12 PM  PHQ 2/9 Scores  PHQ - 2 Score 0 0 0 0 0 0 0  PHQ- 9 Score       0    Fall Risk    03/09/2022    1:42 PM 11/11/2021    8:52 AM 02/24/2021    4:34 PM 12/31/2020    3:22 PM 09/10/2020    1:13 PM  Fall Risk   Falls in the past year? 0 0 0 0 0  Number falls in past yr: 0 0  0   Injury with Fall?  0  0   Risk for fall due to :  No Fall Risks     Follow up Falls evaluation completed Falls evaluation completed Falls evaluation completed Falls evaluation completed Falls evaluation completed    FALL RISK PREVENTION PERTAINING TO THE HOME: Home free of loose throw rugs in walkways, pet beds, electrical cords, etc? Yes  Adequate lighting in your home to reduce risk of falls? Yes   ASSISTIVE DEVICES UTILIZED TO PREVENT FALLS: Use of a cane, walker or w/c? No   TIMED UP AND GO: Was the test performed? No .   Cognitive Function:  Patient is alert and oriented x3.     Immunizations Immunization History  Administered Date(s) Administered   Fluad Quad(high Dose 65+) 09/11/2021   Hepatitis A 02/02/2012, 08/04/2012   Hepatitis A, Adult 03/08/2009, 10/14/2009   Hepatitis B 02/02/1996, 06/03/1996, 10/04/1996   Influenza Split 08/23/2012   Influenza,inj,Quad PF,6+ Mos 08/03/2019, 09/10/2020    Influenza-Unspecified 09/11/2015, 09/06/2018   Moderna Sars-Covid-2 Vaccination 02/21/2020, 03/20/2020, 09/24/2020, 05/28/2021   Pneumococcal Conjugate-13 04/06/2017   Pneumococcal Polysaccharide-23 03/12/2015   Tdap 02/01/2014   Zoster Recombinat (Shingrix) 07/05/2017, 11/30/2018, 02/22/2019   Pneumococcal vaccine status: Due, Education has been provided regarding the importance of this vaccine. Advised may receive this vaccine at local pharmacy or Health Dept. Aware to provide a copy of the vaccination record if obtained from local pharmacy or Health Dept. Verbalized acceptance and understanding. Deferred for follow up with in office visit with PCP.   Screening Tests Health Maintenance  Topic Date Due   DEXA SCAN  Never done   COVID-19 Vaccine (5 - Booster for Moderna series) 03/25/2022 (Originally 07/23/2021)   Pneumonia Vaccine 50+ Years old (3) 05/22/2022 (Originally 10/25/2020)   Fecal DNA (Cologuard)  05/22/2022 (Originally 08/10/2020)   INFLUENZA VACCINE  06/23/2022   MAMMOGRAM  10/15/2022   TETANUS/TDAP  02/02/2024   Hepatitis C Screening  Completed   Zoster Vaccines- Shingrix  Completed   HPV VACCINES  Aged Out   COLONOSCOPY (Pts 45-6yrs Insurance coverage will need to be confirmed)  Discontinued   Health Maintenance Health Maintenance Due  Topic Date Due   DEXA SCAN  Never done   Colonoscopy/Cologuard- deferred per patient. Plans to discuss more with PCP.   Bone density- ordered per consent. Phone number provided for scheduling.   Labs- discussed future lab orders previously ordered. Patient agrees to call back and schedule within appropriate timeframe as directed by physician.   Lung Cancer Screening: (Low Dose CT Chest recommended if Age 2-80 years, 30 pack-year currently smoking OR have quit w/in 15years.) does not qualify.   Vision Screening: Recommended annual ophthalmology exams for  early detection of glaucoma and other disorders of the eye.  Dental Screening:  Recommended annual dental exams for proper oral hygiene  Community Resource Referral / Chronic Care Management: CRR required this visit?  No   CCM required this visit?  No      Plan:   Keep all routine maintenance appointments.   I have personally reviewed and noted the following in the patient's chart:   Medical and social history Use of alcohol, tobacco or illicit drugs  Current medications and supplements including opioid prescriptions. Patient is not currently taking opioid prescriptions. Functional ability and status Nutritional status Physical activity Advanced directives List of other physicians Hospitalizations, surgeries, and ER visits in previous 12 months Vitals Screenings to include cognitive, depression, and falls Referrals and appointments  In addition, I have reviewed and discussed with patient certain preventive protocols, quality metrics, and best practice recommendations. A written personalized care plan for preventive services as well as general preventive health recommendations were provided to patient.     OBrien-Blaney, Nuno Brubacher L, LPN   1/61/0960     I have reviewed the above information and agree with above.   Duncan Dull, MD

## 2022-06-17 ENCOUNTER — Other Ambulatory Visit: Payer: Self-pay | Admitting: Internal Medicine

## 2022-06-17 DIAGNOSIS — E034 Atrophy of thyroid (acquired): Secondary | ICD-10-CM

## 2022-08-05 ENCOUNTER — Other Ambulatory Visit (INDEPENDENT_AMBULATORY_CARE_PROVIDER_SITE_OTHER): Payer: Medicare HMO

## 2022-08-05 DIAGNOSIS — K754 Autoimmune hepatitis: Secondary | ICD-10-CM | POA: Diagnosis not present

## 2022-08-05 LAB — CBC WITH DIFFERENTIAL/PLATELET
Basophils Absolute: 0 10*3/uL (ref 0.0–0.1)
Basophils Relative: 0.7 % (ref 0.0–3.0)
Eosinophils Absolute: 0.2 10*3/uL (ref 0.0–0.7)
Eosinophils Relative: 2.5 % (ref 0.0–5.0)
HCT: 42.5 % (ref 36.0–46.0)
Hemoglobin: 13.9 g/dL (ref 12.0–15.0)
Lymphocytes Relative: 24.4 % (ref 12.0–46.0)
Lymphs Abs: 1.6 10*3/uL (ref 0.7–4.0)
MCHC: 32.8 g/dL (ref 30.0–36.0)
MCV: 96.3 fl (ref 78.0–100.0)
Monocytes Absolute: 0.6 10*3/uL (ref 0.1–1.0)
Monocytes Relative: 10 % (ref 3.0–12.0)
Neutro Abs: 4 10*3/uL (ref 1.4–7.7)
Neutrophils Relative %: 62.4 % (ref 43.0–77.0)
Platelets: 255 10*3/uL (ref 150.0–400.0)
RBC: 4.42 Mil/uL (ref 3.87–5.11)
RDW: 14.2 % (ref 11.5–15.5)
WBC: 6.4 10*3/uL (ref 4.0–10.5)

## 2022-08-05 LAB — BASIC METABOLIC PANEL
BUN: 14 mg/dL (ref 6–23)
CO2: 27 mEq/L (ref 19–32)
Calcium: 9.6 mg/dL (ref 8.4–10.5)
Chloride: 104 mEq/L (ref 96–112)
Creatinine, Ser: 0.72 mg/dL (ref 0.40–1.20)
GFR: 86.91 mL/min (ref >60.00)
Glucose, Bld: 93 mg/dL (ref 70–99)
Potassium: 4.3 mEq/L (ref 3.5–5.1)
Sodium: 139 mEq/L (ref 135–145)

## 2022-08-05 LAB — HEPATIC FUNCTION PANEL
ALT: 14 U/L (ref 0–35)
AST: 19 U/L (ref 0–37)
Albumin: 4.3 g/dL (ref 3.5–5.2)
Alkaline Phosphatase: 66 U/L (ref 39–117)
Bilirubin, Direct: 0.1 mg/dL (ref 0.0–0.3)
Total Bilirubin: 0.5 mg/dL (ref 0.2–1.2)
Total Protein: 7.6 g/dL (ref 6.0–8.3)

## 2022-08-05 LAB — PROTIME-INR
INR: 1 ratio (ref 0.8–1.0)
Prothrombin Time: 10.8 s (ref 9.6–13.1)

## 2022-08-07 LAB — AFP TUMOR MARKER: AFP-Tumor Marker: 3.8 ng/mL

## 2022-08-26 ENCOUNTER — Other Ambulatory Visit: Payer: Self-pay | Admitting: Internal Medicine

## 2022-09-17 ENCOUNTER — Other Ambulatory Visit: Payer: Self-pay | Admitting: Internal Medicine

## 2022-09-17 DIAGNOSIS — E034 Atrophy of thyroid (acquired): Secondary | ICD-10-CM

## 2022-09-25 ENCOUNTER — Ambulatory Visit: Payer: Medicare HMO | Admitting: Internal Medicine

## 2022-09-30 ENCOUNTER — Ambulatory Visit: Payer: Medicare HMO | Admitting: Internal Medicine

## 2022-10-06 ENCOUNTER — Ambulatory Visit: Payer: Medicare HMO | Admitting: Internal Medicine

## 2022-10-07 ENCOUNTER — Ambulatory Visit (INDEPENDENT_AMBULATORY_CARE_PROVIDER_SITE_OTHER): Payer: Medicare HMO | Admitting: Internal Medicine

## 2022-10-07 ENCOUNTER — Encounter: Payer: Self-pay | Admitting: Internal Medicine

## 2022-10-07 VITALS — BP 120/72 | HR 78 | Temp 97.9°F | Ht 62.0 in | Wt 132.4 lb

## 2022-10-07 DIAGNOSIS — R5383 Other fatigue: Secondary | ICD-10-CM

## 2022-10-07 DIAGNOSIS — E785 Hyperlipidemia, unspecified: Secondary | ICD-10-CM | POA: Diagnosis not present

## 2022-10-07 DIAGNOSIS — Z1211 Encounter for screening for malignant neoplasm of colon: Secondary | ICD-10-CM | POA: Diagnosis not present

## 2022-10-07 DIAGNOSIS — R030 Elevated blood-pressure reading, without diagnosis of hypertension: Secondary | ICD-10-CM

## 2022-10-07 DIAGNOSIS — Z1231 Encounter for screening mammogram for malignant neoplasm of breast: Secondary | ICD-10-CM | POA: Diagnosis not present

## 2022-10-07 DIAGNOSIS — E034 Atrophy of thyroid (acquired): Secondary | ICD-10-CM | POA: Diagnosis not present

## 2022-10-07 DIAGNOSIS — Z0000 Encounter for general adult medical examination without abnormal findings: Secondary | ICD-10-CM

## 2022-10-07 DIAGNOSIS — K754 Autoimmune hepatitis: Secondary | ICD-10-CM

## 2022-10-07 DIAGNOSIS — R7301 Impaired fasting glucose: Secondary | ICD-10-CM

## 2022-10-07 NOTE — Patient Instructions (Addendum)
Your annual mammogram AND  DEXA  SCAN have been ordered.  Please call [place] to call to make your appointments  .  The phone number for [facility] is  Arabi 2022.  THIS IS TYPICALLY REPEATED EVERY 3 YRS,  OR  YOU CAN  PLAN TO HAVE A COLONOSPY IN 2025        Return in mid December for labs and pneumonia vaccine

## 2022-10-07 NOTE — Progress Notes (Signed)
Patient ID: Krista Olson, female    DOB: [birth year]  Age: 66 y.o. MRN: [id number]  The patient is here for annual preventive examination and management of other chronic and acute problems.   The risk factors are reflected in the social history.  The roster of all physicians providing medical care to patient - is listed in the Snapshot section of the chart.  Activities of daily living:  The patient is 100% independent in all ADLs: dressing, toileting, feeding as well as independent mobility  Home safety : The patient has smoke detectors in the home. They wear seatbelts.  There are no firearms at home. There is no violence in the home.   There is no risks for hepatitis, STDs or HIV. There is no   history of blood transfusion. They have no travel history to infectious disease endemic areas of the world.  The patient has seen their dentist in the last six month. They have seen their eye doctor in the last year. They admit to slight hearing difficulty with regard to whispered voices and some television programs.  They have deferred audiologic testing in the last year.  They do not  have excessive sun exposure. Discussed the need for sun protection: hats, long sleeves and use of sunscreen if there is significant sun exposure.   Diet: the importance of a healthy diet is discussed. They do have a healthy diet.  The benefits of regular aerobic exercise were discussed. She walks 4 times per week ,  20 minutes.   Depression screen: there are no signs or vegative symptoms of depression- irritability, change in appetite, anhedonia, sadness/tearfullness.  Cognitive assessment: the patient manages all their financial and personal affairs and is actively engaged. They could relate day,date,year and events; recalled 2/3 objects at 3 minutes; performed clock-face test normally.  The following portions of the patient's history were reviewed and updated as appropriate: allergies, current medications, past family  history, past medical history,  past surgical history, past social history  and problem list.  Visual acuity was not assessed per patient preference since she has regular follow up with her ophthalmologist. Hearing and body mass index were assessed and reviewed.   During the course of the visit the patient was educated and counseled about appropriate screening and preventive services including : fall prevention , diabetes screening, nutrition counseling, colorectal cancer screening, and recommended immunizations.    CC: The primary encounter diagnosis was Encounter for screening mammogram for malignant neoplasm of breast. Diagnoses of Colon cancer screening, Hypothyroidism due to acquired atrophy of thyroid, Hyperlipidemia LDL goal <160, Impaired fasting glucose, and Other fatigue were also pertinent to this visit.  1) colon CA screening;  cologuard results negative 2022.  2)    History Krista Olson has a past medical history of Hepatitis, autoimmune ([place]) and Thyroid disease.   She has a past surgical history that includes Percutaneous liver biopsy.   Her family history includes Breast cancer (age of onset: 85) in her maternal aunt; Cancer (age of onset: 18) in her maternal aunt; Cancer (age of onset: 74) in her brother; Goiter in her mother; Heart disease in her brother; Heart disease (age of onset: 79) in her mother; Kidney disease in her father.She reports that she has never smoked. She has never used smokeless tobacco. She reports current alcohol use of about 5.0 standard drinks of alcohol per week. She reports that she does not use drugs.  Outpatient Medications Prior to Visit  Medication Sig Dispense Refill  ascorbic acid (VITAMIN C) 250 MG CHEW Chew by mouth.     atorvastatin (LIPITOR) 10 MG tablet TAKE 1 TABLET BY MOUTH AT BEDTIME 90 tablet 1   azaTHIOprine (IMURAN) 50 MG tablet Take 1 tablet (50 mg total) by mouth daily. 30 tablet 5   Azelaic Acid 15 % gel After skin is thoroughly washed  and patted dry, gently but thoroughly massage a thin film of azelaic acid cream into the affected area twice daily, in the morning and evening. 50 g 5   Calcium Carbonate-Vitamin D (CALCIUM + D PO) Take 1 tablet by mouth daily.     fish oil-omega-3 fatty acids 1000 MG capsule Take 1 capsule by mouth daily.     glucosamine-chondroitin 500-400 MG tablet Take 1 tablet by mouth 3 (three) times daily.     levothyroxine (SYNTHROID) 50 MCG tablet TAKE 1 TABLET BY MOUTH ONCE DAILY ON AN EMPTY STOMACH. WAIT 30 MINUTES BEFORE TAKING OTHER MEDS. 90 tablet 1   tretinoin (RETIN-A) 0.05 % cream Apply topically at bedtime. (Patient not taking: Reported on 10/07/2022) 45 g 5   No facility-administered medications prior to visit.    Review of Systems  Objective:  BP 118/88 (BP Location: Left Arm, Patient Position: Sitting, Cuff Size: Normal)   Pulse 78   Temp 97.9 F (36.6 C) (Oral)   Ht 5' 2" (1.575 m)   Wt 132 lb 6.4 oz (60.1 kg)   SpO2 96%   BMI 24.22 kg/m   Physical Exam  Physical Exam   Assessment & Plan:   Problem List Items Addressed This Visit     Hyperlipidemia LDL goal <160   Relevant Orders   Lipid Profile   Direct LDL   Hypothyroidism   Relevant Orders   TSH   Other Visit Diagnoses     Encounter for screening mammogram for malignant neoplasm of breast    -  Primary   Relevant Orders   MM 3D SCREEN BREAST BILATERAL   Colon cancer screening       Relevant Orders   Cologuard   Impaired fasting glucose       Relevant Orders   Comp Met (CMET)   HgB A1c   Other fatigue       Relevant Orders   CBC with Differential/Platelet       I am having Krista Olson. Krista Olson maintain her Calcium Carbonate-Vitamin D (CALCIUM + D PO), fish oil-omega-3 fatty acids, azaTHIOprine, ascorbic acid, glucosamine-chondroitin, Azelaic Acid, tretinoin, atorvastatin, and levothyroxine.    I provided 40 minutes of  face-to-face time during this encounter reviewing patient's current problems and past  surgeries,  recent labs and imaging studies, providing counseling on the above mentioned problems , and coordination  of care .   Follow-up: No follow-ups on file.   Crecencio Mc, MD

## 2022-10-08 ENCOUNTER — Encounter: Payer: Self-pay | Admitting: Internal Medicine

## 2022-10-08 NOTE — Assessment & Plan Note (Signed)
She has  no prior history of hypertension  home readings have been done and reviewed ; all have been < 130/80

## 2022-10-08 NOTE — Assessment & Plan Note (Signed)
Treated with Imuran x 3 + years.  Repeat liver enzymes are normal. .  Annual Follow up with Dr. Drue Novel has  been done in March ,. Hepatitis A&B vaccines up to date .    Lab Results  Component Value Date   ALT 14 08/05/2022   AST 19 08/05/2022   ALKPHOS 66 08/05/2022   BILITOT 0.5 08/05/2022

## 2022-10-08 NOTE — Assessment & Plan Note (Signed)

## 2022-11-17 ENCOUNTER — Ambulatory Visit
Admission: RE | Admit: 2022-11-17 | Discharge: 2022-11-17 | Disposition: A | Discharge status: Home / Self Care | Payer: Medicare HMO | Source: Ambulatory Visit | Attending: Internal Medicine | Admitting: Internal Medicine

## 2022-11-17 DIAGNOSIS — Z1231 Encounter for screening mammogram for malignant neoplasm of breast: Secondary | ICD-10-CM | POA: Insufficient documentation

## 2022-12-07 ENCOUNTER — Encounter: Payer: Medicare HMO | Admitting: Dermatology

## 2022-12-15 ENCOUNTER — Other Ambulatory Visit (INDEPENDENT_AMBULATORY_CARE_PROVIDER_SITE_OTHER): Payer: Medicare HMO

## 2022-12-15 DIAGNOSIS — R5383 Other fatigue: Secondary | ICD-10-CM | POA: Diagnosis not present

## 2022-12-15 DIAGNOSIS — E785 Hyperlipidemia, unspecified: Secondary | ICD-10-CM | POA: Diagnosis not present

## 2022-12-15 DIAGNOSIS — E034 Atrophy of thyroid (acquired): Secondary | ICD-10-CM | POA: Diagnosis not present

## 2022-12-15 DIAGNOSIS — R7301 Impaired fasting glucose: Secondary | ICD-10-CM

## 2022-12-15 LAB — CBC WITH DIFFERENTIAL/PLATELET
Basophils Absolute: 0.1 10*3/uL (ref 0.0–0.1)
Basophils Relative: 1.2 % (ref 0.0–3.0)
Eosinophils Absolute: 0.1 10*3/uL (ref 0.0–0.7)
Eosinophils Relative: 2.3 % (ref 0.0–5.0)
HCT: 43.7 % (ref 36.0–46.0)
Hemoglobin: 14.5 g/dL (ref 12.0–15.0)
Lymphocytes Relative: 25.2 % (ref 12.0–46.0)
Lymphs Abs: 1.3 10*3/uL (ref 0.7–4.0)
MCHC: 33.1 g/dL (ref 30.0–36.0)
MCV: 94.4 fl (ref 78.0–100.0)
Monocytes Absolute: 0.5 10*3/uL (ref 0.1–1.0)
Monocytes Relative: 9.5 % (ref 3.0–12.0)
Neutro Abs: 3.2 10*3/uL (ref 1.4–7.7)
Neutrophils Relative %: 61.8 % (ref 43.0–77.0)
Platelets: 279 10*3/uL (ref 150.0–400.0)
RBC: 4.63 Mil/uL (ref 3.87–5.11)
RDW: 14 % (ref 11.5–15.5)
WBC: 5.3 10*3/uL (ref 4.0–10.5)

## 2022-12-15 LAB — COMPREHENSIVE METABOLIC PANEL
ALT: 13 U/L (ref 0–35)
AST: 17 U/L (ref 0–37)
Albumin: 4.5 g/dL (ref 3.5–5.2)
Alkaline Phosphatase: 66 U/L (ref 39–117)
BUN: 15 mg/dL (ref 6–23)
CO2: 28 mEq/L (ref 19–32)
Calcium: 9.9 mg/dL (ref 8.4–10.5)
Chloride: 102 mEq/L (ref 96–112)
Creatinine, Ser: 0.68 mg/dL (ref 0.40–1.20)
GFR: 90.3 mL/min (ref >60.00)
Glucose, Bld: 100 mg/dL — ABNORMAL HIGH (ref 70–99)
Potassium: 4.1 mEq/L (ref 3.5–5.1)
Sodium: 138 mEq/L (ref 135–145)
Total Bilirubin: 0.6 mg/dL (ref 0.2–1.2)
Total Protein: 8 g/dL (ref 6.0–8.3)

## 2022-12-15 LAB — LIPID PANEL
Cholesterol: 219 mg/dL — ABNORMAL HIGH (ref 0–200)
HDL: 64.1 mg/dL (ref >39.00)
LDL Cholesterol: 131 mg/dL — ABNORMAL HIGH (ref 0–99)
NonHDL: 154.62
Total CHOL/HDL Ratio: 3
Triglycerides: 119 mg/dL (ref 0.0–149.0)
VLDL: 23.8 mg/dL (ref 0.0–40.0)

## 2022-12-15 LAB — TSH: TSH: 3.84 u[IU]/mL (ref 0.35–5.50)

## 2022-12-15 LAB — LDL CHOLESTEROL, DIRECT: Direct LDL: 134 mg/dL

## 2022-12-15 LAB — HEMOGLOBIN A1C: Hgb A1c MFr Bld: 6.2 % (ref 4.6–6.5)

## 2022-12-17 ENCOUNTER — Ambulatory Visit (INDEPENDENT_AMBULATORY_CARE_PROVIDER_SITE_OTHER): Payer: Medicare HMO | Admitting: *Deleted

## 2022-12-17 DIAGNOSIS — Z23 Encounter for immunization: Secondary | ICD-10-CM

## 2022-12-17 NOTE — Progress Notes (Signed)
Pt received Prevnar 20 injection in left deltoid. Pt tolerated it well with no complaints or concerns.

## 2022-12-22 ENCOUNTER — Ambulatory Visit (INDEPENDENT_AMBULATORY_CARE_PROVIDER_SITE_OTHER): Payer: Medicare HMO | Admitting: Dermatology

## 2022-12-22 ENCOUNTER — Encounter: Payer: Self-pay | Admitting: Dermatology

## 2022-12-22 VITALS — BP 129/85 | HR 71

## 2022-12-22 DIAGNOSIS — D229 Melanocytic nevi, unspecified: Secondary | ICD-10-CM

## 2022-12-22 DIAGNOSIS — D2271 Melanocytic nevi of right lower limb, including hip: Secondary | ICD-10-CM

## 2022-12-22 DIAGNOSIS — L821 Other seborrheic keratosis: Secondary | ICD-10-CM

## 2022-12-22 DIAGNOSIS — D2371 Other benign neoplasm of skin of right lower limb, including hip: Secondary | ICD-10-CM

## 2022-12-22 DIAGNOSIS — D2262 Melanocytic nevi of left upper limb, including shoulder: Secondary | ICD-10-CM | POA: Diagnosis not present

## 2022-12-22 DIAGNOSIS — L814 Other melanin hyperpigmentation: Secondary | ICD-10-CM

## 2022-12-22 DIAGNOSIS — L578 Other skin changes due to chronic exposure to nonionizing radiation: Secondary | ICD-10-CM

## 2022-12-22 DIAGNOSIS — Z1283 Encounter for screening for malignant neoplasm of skin: Secondary | ICD-10-CM

## 2022-12-22 DIAGNOSIS — D225 Melanocytic nevi of trunk: Secondary | ICD-10-CM

## 2022-12-22 DIAGNOSIS — L719 Rosacea, unspecified: Secondary | ICD-10-CM | POA: Diagnosis not present

## 2022-12-22 MED ORDER — TRETINOIN 0.05 % EX CREA: TOPICAL_CREAM | CUTANEOUS | 11 refills | Status: AC

## 2022-12-22 MED ORDER — AZELAIC ACID 15 % EX GEL: CUTANEOUS | 5 refills | Status: DC

## 2022-12-22 MED ORDER — RHOFADE 1 % EX CREA: TOPICAL_CREAM | CUTANEOUS | 1 refills | Status: AC

## 2022-12-22 NOTE — Patient Instructions (Addendum)
Start rhofade 1 % cream - apply topically to aa of face in morning for rosacea  Continue Finacea Gel - apply to face twice daily for rosacea   Continue Tretinoin 0.05 % cream - apply a pea sized amount to face nightly as tolerated  Topical retinoid medications like tretinoin/Retin-A, adapalene/Differin, tazarotene/Fabior, and Epiduo/Epiduo Forte can cause dryness and irritation when first started. Only apply a pea-sized amount to the entire affected area. Avoid applying it around the eyes, edges of mouth and creases at the nose. If you experience irritation, use a good moisturizer first and/or apply the medicine less often. If you are doing well with the medicine, you can increase how often you use it until you are applying every night. Be careful with sun protection while using this medication as it can make you sensitive to the sun. This medicine should not be used by pregnant women.     Your prescription was sent to [facility] in [place]. A representative from [facility] will contact you within 3 business hours to verify your address and insurance information to schedule a free delivery. If for any reason you do not receive a phone call from them, please reach out to them. Their phone number is [phone] and their hours are Monday-Friday 9:00 am-5:00 pm.     For temporary redness reduction Mix 1 bottle of Afrin original in 1 bottle of Cerave PM, shake well, and apply 1 hour before you want redness to be improved in the morning   Discussed BBL recommend 2 - 3 treatments for face to treat rosacea Best time to treat is in winter/fall months  Counseling for BBL / IPL / Laser and Coordination of Care Discussed the treatment option of Broad Band Light (BBL) Daron Offer Pulsed Light (IPL)/ Laser for skin discoloration, including brown spots and redness.  Typically we recommend at least 1-3 treatment sessions about 5-8 weeks apart for best results.  Cannot have tanned skin when BBL  performed, and regular use of sunscreen is advised after the procedure to help maintain results. The patient's condition may also require "maintenance treatments" in the future.  The fee for BBL / laser treatments is $350 per treatment session for the whole face.  A fee can be quoted for other parts of the body.  Insurance typically does not pay for BBL/laser treatments and therefore the fee is an out-of-pocket cost.     Rosacea  What is rosacea? Rosacea (say: ro-zay-sha) is a common skin disease that usually begins as a trend of flushing or blushing easily.  As rosacea progresses, a persistent redness in the center of the face will develop and may gradually spread beyond the nose and cheeks to the forehead and chin.  In some cases, the ears, chest, and back could be affected.  Rosacea may appear as tiny blood vessels or small red bumps that occur in crops.  Frequently they can contain pus, and are called "pustules".  If the bumps do not contain pus, they are referred to as "papules".  Rarely, in prolonged, untreated cases of rosacea, the oil glands of the nose and cheeks may become permanently enlarged.  This is called rhinophyma, and is seen more frequently in men.  Signs and Risks In its beginning stages, rosacea tends to come and go, which makes it difficult to recognize.  It can start as intermittent flushing of the face.  Eventually, blood vessels may become permanently visible.  Pustules and papules can appear, but can be mistaken for adult  acne.  People of all races, ages, genders and ethnic groups are at risk of developing rosacea.  However, it is more common in women (especially around menopause) and adults with fair skin between the ages of 63 and 43.  Treatment Dermatologists typically recommend a combination of treatments to effectively manage rosacea.  Treatment can improve symptoms and may stop the progression of the rosacea.  Treatment may involve both topical and oral medications.  The  tetracycline antibiotics are often used for their anti-inflammatory effect; however, because of the possibility of developing antibiotic resistance, they should not be used long term at full dose.  For dilated blood vessels the options include electrodessication (uses electric current through a small needle), laser treatment, and cosmetics to hide the redness.   With all forms of treatment, improvement is a slow process, and patients may not see any results for the first 3-4 weeks.  It is very important to avoid the sun and other triggers.  Patients must wear sunscreen daily.  Skin Care Instructions: Cleanse the skin with a mild soap such as CeraVe cleanser, Cetaphil cleanser, or Dove soap once or twice daily as needed. Moisturize with Eucerin Redness Relief Daily Perfecting Lotion (has a subtle green tint), CeraVe Moisturizing Cream, or Oil of Olay Daily Moisturizer with sunscreen every morning and/or night as recommended. Makeup should be "non-comedogenic" (won't clog pores) and be labeled "for sensitive skin". Good choices for cosmetics are: Neutrogena, Almay, and Physician's Formula.  Any product with a green tint tends to offset a red complexion. If your eyes are dry and irritated, use artificial tears 2-3 times per day and cleanse the eyelids daily with baby shampoo.  Have your eyes examined at least every 2 years.  Be sure to tell your eye doctor that you have rosacea. Alcoholic beverages tend to cause flushing of the skin, and may make rosacea worse. Always wear sunscreen, protect your skin from extreme hot and cold temperatures, and avoid spicy foods, hot drinks, and mechanical irritation such as rubbing, scrubbing, or massaging the face.  Avoid harsh skin cleansers, cleansing masks, astringents, and exfoliation. If a particular product burns or makes your face feel tight, then it is likely to flare your rosacea. If you are having difficulty finding a sunscreen that you can tolerate, you may try  switching to a chemical-free sunscreen.  These are ones whose active ingredient is zinc oxide or titanium dioxide only.  They should also be fragrance free, non-comedogenic, and labeled for sensitive skin. Rosacea triggers may vary from person to person.  There are a variety of foods that have been reported to trigger rosacea.  Some patients find that keeping a diary of what they were doing when they flared helps them avoid triggers.         Melanoma ABCDEs  Melanoma is the most dangerous type of skin cancer, and is the leading cause of death from skin disease.  You are more likely to develop melanoma if you: Have light-colored skin, light-colored eyes, or red or blond hair Spend a lot of time in the sun Tan regularly, either outdoors or in a tanning bed Have had blistering sunburns, especially during childhood Have a close family member who has had a melanoma Have atypical moles or large birthmarks  Early detection of melanoma is key since treatment is typically straightforward and cure rates are extremely high if we catch it early.   The first sign of melanoma is often a change in a mole or a new  dark spot.  The ABCDE system is a way of remembering the signs of melanoma.  A for asymmetry:  The two halves do not match. B for border:  The edges of the growth are irregular. C for color:  A mixture of colors are present instead of an even brown color. D for diameter:  Melanomas are usually (but not always) greater than 14m - the size of a pencil eraser. E for evolution:  The spot keeps changing in size, shape, and color.  Please check your skin once per month between visits. You can use a small mirror in front and a large mirror behind you to keep an eye on the back side or your body.   If you see any new or changing lesions before your next follow-up, please call to schedule a visit.  Please continue daily skin protection including broad spectrum sunscreen SPF 30+ to sun-exposed  areas, reapplying every 2 hours as needed when you're outdoors.   Staying in the shade or wearing long sleeves, sun glasses (UVA+UVB protection) and wide brim hats (4-inch brim around the entire circumference of the hat) are also recommended for sun protection.     Due to recent changes in healthcare laws, you may see results of your pathology and/or laboratory studies on MyChart before the doctors have had a chance to review them. We understand that in some cases there may be results that are confusing or concerning to you. Please understand that not all results are received at the same time and often the doctors may need to interpret multiple results in order to provide you with the best plan of care or course of treatment. Therefore, we ask that you please give u[place]2 business days to thoroughly review all your results before contacting the office for clarification. Should we see a critical lab result, you will be contacted sooner.   If You Need Anything After Your Visit  If you have any questions or concerns for your doctor, please call our main line at [phone] press option 4 to reach your doctor's medical assistant. If no one answers, please leave a voicemail as directed and we will return your call as soon as possible. Messages left after 4 pm will be answered the following business day.   You may also send u[place] message via [facility] We typically respond to MyChart messages within 1-2 business days.  For prescription refills, please ask your pharmacy to contact our office. Our fax number is [phone]  If you have an urgent issue when the clinic is closed that cannot wait until the next business day, you can page your doctor at the number below.    Please note that while we do our best to be available for urgent issues outside of office hours, we are not available 24/7.   If you have an urgent issue and are unable to reach u[place] you may choose to seek medical care at your doctor's  office, retail clinic, urgent care center, or emergency room.  If you have a medical emergency, please immediately call 911 or go to the emergency department.  Pager Numbers  - Dr. KNehemiah Massed [phone] - Dr. MLaurence Ferrari [phone] - Dr. SNicole Kindred [phone] In the event of inclement weather, please call our main line at [phone] an update on the status of any delays or closures.  Dermatology Medication Tips: Please keep the boxes that topical medications come in in order to help keep track of the instructions about  where and how to use these. Pharmacies typically print the medication instructions only on the boxes and not directly on the medication tubes.   If your medication is too expensive, please contact our office at [phone] option 4 or send [place] a message through [place].   We are unable to tell what your co-pay for medications will be in advance as this is different depending on your insurance coverage. However, we may be able to find a substitute medication at lower cost or fill out paperwork to get insurance to cover a needed medication.   If a prior authorization is required to get your medication covered by your insurance company, please allow [place] 1-2 business days to complete this process.  Drug prices often vary depending on where the prescription is filled and some pharmacies may offer cheaper prices.  The website www.goodrx.com contains coupons for medications through different pharmacies. The prices here do not account for what the cost may be with help from insurance (it may be cheaper with your insurance), but the website can give you the price if you did not use any insurance.  - You can print the associated coupon and take it with your prescription to the pharmacy.  - You may also stop by our office during regular business hours and pick up a GoodRx coupon card.  - If you need your prescription sent electronically to a different pharmacy, notify our office  through [facility] or by phone at [phone] option 4.     Si Usted Necesita Algo Despus de Su Visita  Tambin puede enviarnos un mensaje a travs de Pharmacist, community. Por lo general respondemos a los mensajes de MyChart en el transcurso de 1 a 2 das hbiles.  Para renovar recetas, por favor pida a su farmacia que se ponga en contacto con nuestra oficina. Harland Dingwall de fax es [place] [phone].  Si tiene un asunto urgente cuando la clnica est cerrada y que no puede esperar hasta el siguiente da hbil, puede llamar/localizar a su doctor(a) al nmero que aparece a continuacin.   Por favor, tenga en cuenta que aunque hacemos todo lo posible para estar disponibles para asuntos urgentes fuera del horario de [place], no estamos disponibles las 24 horas del da, los 7 das de la [place].   Si tiene un problema urgente y no puede comunicarse con nosotros, puede optar por buscar atencin mdica  en el consultorio de su doctor(a), en una clnica privada, en un centro de atencin urgente o en una sala de emergencias.  Si tiene Engineering geologist, por favor llame inmediatamente al 911 o vaya a la sala de emergencias.  Nmeros de bper  - Dr. Nehemiah Massed: [phone]  - Dra. Moye: [phone]  - Dra. Nicole Kindred: [phone]  En caso de inclemencias del [place], por favor llame a Johnsie Kindred principal al [phone] para una actualizacin sobre el [place] de cualquier retraso o cierre.  Consejos para la medicacin en dermatologa: Por favor, guarde las cajas en las que vienen los medicamentos de uso tpico para ayudarle a seguir las instrucciones sobre dnde y cmo usarlos. Las farmacias generalmente imprimen las instrucciones del medicamento slo en las cajas y no directamente en los tubos del [place].   Si su medicamento es muy caro, por favor, pngase en contacto con Zigmund Daniel llamando al [phone] y presione la opcin 4 o envenos un mensaje a travs de Pharmacist, community.   No  podemos decirle cul ser su copago por los medicamentos por adelantado ya que esto  es diferente dependiendo de la cobertura de su seguro. Sin embargo, es posible que podamos encontrar un medicamento sustituto a Electrical engineer un formulario para que el seguro cubra el medicamento que se considera necesario.   Si se requiere una autorizacin previa para que su compaa de seguros [place] su medicamento, por favor permtanos de 1 a 2 das hbiles para completar este proceso.  Los precios de los medicamentos varan con frecuencia dependiendo del Environmental consultant de dnde se surte la receta y alguna farmacias pueden ofrecer precios ms baratos.  El sitio web www.goodrx.com tiene cupones para medicamentos de Airline pilot. Los precios aqu no tienen en cuenta lo que podra costar con la ayuda del seguro (puede ser ms barato con su seguro), pero el sitio web puede darle el precio si no utiliz Research scientist (physical sciences).  - Puede imprimir el cupn correspondiente y llevarlo con su receta a la farmacia.  - Tambin puede pasar por nuestra oficina durante el horario de atencin regular y Charity fundraiser una tarjeta de cupones de GoodRx.  - Si necesita que su receta se enve electrnicamente a una farmacia diferente, informe a nuestra oficina a travs de MyChart de [place] o por telfono llamando al [phone] y presione la opcin 4.

## 2022-12-22 NOTE — Progress Notes (Signed)
Follow-Up Visit   Subjective  Krista Olson is a 67 y.o. female who presents for the following: Annual Exam (1 year tbse. Hx of rosacea on finacea gel and tretinoin 0.05 % cream. Would like to discuss other treatments. /).  The patient presents for Total-Body Skin Exam (TBSE) for skin cancer screening and mole check.  The patient has spots, moles and lesions to be evaluated, some may be new or changing and the patient has concerns that these could be cancer.   The following portions of the chart were reviewed this encounter and updated as appropriate:      Review of Systems: No other skin or systemic complaints except as noted in HPI or Assessment and Plan.   Objective  Well appearing patient in no apparent distress; mood and affect are within normal limits.  A full examination was performed including scalp, head, eyes, ears, nose, lips, neck, chest, axillae, abdomen, back, buttocks, bilateral upper extremities, bilateral lower extremities, hands, feet, fingers, toes, fingernails, and toenails. All findings within normal limits unless otherwise noted below.  face Erythema with telangectasia at malar cheeks and nose  left upper arm above elbow 4 mm medium brown papule   right posterior thigh 1.5 mm medium dark brown macule   Left sacrum 1 mm medium dark brown macule    Assessment & Plan  Rosacea face  Chronic and persistent condition with duration or expected duration over one year. Condition is symptomatic/ bothersome to patient. Not currently at goal.   Rosacea is a chronic progressive skin condition usually affecting the face of adults, causing redness and/or acne bumps. It is treatable but not curable. It sometimes affects the eyes (ocular rosacea) as well. It may respond to topical and/or systemic medication and can flare with stress, sun exposure, alcohol, exercise, topical steroids (including hydrocortisone/cortisone 10) and some foods.  Daily application of broad  spectrum spf 30+ sunscreen to face is recommended to reduce flares.  Pateint would like to alternative treatments, patient bothered by redness.   Discussed starting Rhofade to help with redness. May not be covered by insurance- will send to [facility] Rhofade 1 % cream - apply topically to aa of face qam.   Or if too expensive  Can start  For temporary redness reduction Mix 1 bottle of Afrin original in 1 bottle of Cerave PM, shake well, and apply 1 hour before you want redness to be improved in the morning  Also Discussed BBL , recommend 2 - 3 treatments  Counseling for BBL / IPL / Laser and Coordination of Care Discussed the treatment option of Broad Band Light (BBL) Daron Offer Pulsed Light (IPL)/ Laser for skin discoloration, including brown spots and redness.  Typically we recommend at least 1-3 treatment sessions about 5-8 weeks apart for best results.  Cannot have tanned skin when BBL performed, and regular use of sunscreen is advised after the procedure to help maintain results. The patient's condition may also require "maintenance treatments" in the future.  The fee for BBL / laser treatments is $350 per treatment session for the whole face.  A fee can be quoted for other parts of the body.  Insurance typically does not pay for BBL/laser treatments and therefore the fee is an out-of-pocket cost.  Continue Azelaic Acid 15 % gel - apply to face bid  Continue Tretinoin 0.05 % cream - apply topically a pea sized amount to face qhs as tolerated.    Topical retinoid medications like tretinoin/Retin-A, adapalene/Differin, tazarotene/Fabior,  and Epiduo/Epiduo Forte can cause dryness and irritation when first started. Only apply a pea-sized amount to the entire affected area. Avoid applying it around the eyes, edges of mouth and creases at the nose. If you experience irritation, use a good moisturizer first and/or apply the medicine less often. If you are doing well with the medicine, you can  increase how often you use it until you are applying every night. Be careful with sun protection while using this medication as it can make you sensitive to the sun. This medicine should not be used by pregnant women.   Oxymetazoline HCl (RHOFADE) 1 % CREA - face Apply topically to aa of face for rosacea in the morning  tretinoin (RETIN-A) 0.05 % cream - face Apply topically a  pea sized amount to face qhs as tolerated.  Related Medications Azelaic Acid 15 % gel After skin is thoroughly washed and patted dry, gently but thoroughly massage a thin film of azelaic acid cream into the affected area twice daily, in the morning and evening.  Nevus (3) left upper arm above elbow; right posterior thigh; Left sacrum  Benign-appearing. Stable compared to previous visit. Observation.  Call clinic for new or changing moles.  Recommend daily use of broad spectrum spf 30+ sunscreen to sun-exposed areas.    Lentigines - Scattered tan macules - Due to sun exposure - Benign-appearing, observe - Recommend daily broad spectrum sunscreen SPF 30+ to sun-exposed areas, reapply every 2 hours as needed. - Call for any changes  Seborrheic Keratoses - Stuck-on, waxy, tan-brown papules and/or plaques  - Benign-appearing - Discussed benign etiology and prognosis. - Observe - Call for any changes  Melanocytic Nevi - Tan-brown and/or pink-flesh-colored symmetric macules and papules - Benign appearing on exam today - Observation - Call clinic for new or changing moles - Recommend daily use of broad spectrum spf 30+ sunscreen to sun-exposed areas.   Hemangiomas - Red papules - Discussed benign nature - Observe - Call for any changes  Dermatofibroma - Firm pink/brown papulenodule with dimple sign at right lateral knee - Benign appearing - Call for any changes  Actinic Damage - Chronic condition, secondary to cumulative UV/sun exposure - diffuse scaly erythematous macules with underlying  dyspigmentation - Recommend daily broad spectrum sunscreen SPF 30+ to sun-exposed areas, reapply every 2 hours as needed.  - Staying in the shade or wearing long sleeves, sun glasses (UVA+UVB protection) and wide brim hats (4-inch brim around the entire circumference of the hat) are also recommended for sun protection.  - Call for new or changing lesions.  Skin cancer screening performed today. Return in about 1 year (around 12/23/2023) for TBSE.  I, Ruthell Rummage, CMA, am acting as scribe for Brendolyn Patty, MD.  Documentation: I have reviewed the above documentation for accuracy and completeness, and I agree with the above.  Brendolyn Patty MD

## 2023-01-20 ENCOUNTER — Ambulatory Visit
Admission: RE | Admit: 2023-01-20 | Discharge: 2023-01-20 | Disposition: A | Discharge status: Home / Self Care | Payer: Medicare HMO | Source: Ambulatory Visit | Attending: Internal Medicine | Admitting: Internal Medicine

## 2023-01-20 DIAGNOSIS — Z780 Asymptomatic menopausal state: Secondary | ICD-10-CM

## 2023-01-28 ENCOUNTER — Encounter: Payer: Self-pay | Admitting: Internal Medicine

## 2023-02-26 ENCOUNTER — Other Ambulatory Visit: Payer: Self-pay | Admitting: Internal Medicine

## 2023-03-06 ENCOUNTER — Observation Stay
Admission: EM | Admit: 2023-03-06 | Discharge: 2023-03-07 | Disposition: A | Discharge status: Home / Self Care | Payer: Medicare HMO | Attending: Internal Medicine | Admitting: Internal Medicine

## 2023-03-06 ENCOUNTER — Emergency Department: Payer: Medicare HMO

## 2023-03-06 ENCOUNTER — Other Ambulatory Visit: Payer: Self-pay

## 2023-03-06 DIAGNOSIS — J069 Acute upper respiratory infection, unspecified: Secondary | ICD-10-CM | POA: Diagnosis not present

## 2023-03-06 DIAGNOSIS — R55 Syncope and collapse: Secondary | ICD-10-CM | POA: Diagnosis present

## 2023-03-06 DIAGNOSIS — Z1152 Encounter for screening for COVID-19: Secondary | ICD-10-CM | POA: Diagnosis not present

## 2023-03-06 DIAGNOSIS — E039 Hypothyroidism, unspecified: Secondary | ICD-10-CM | POA: Diagnosis not present

## 2023-03-06 DIAGNOSIS — J00 Acute nasopharyngitis [common cold]: Secondary | ICD-10-CM | POA: Diagnosis not present

## 2023-03-06 DIAGNOSIS — E034 Atrophy of thyroid (acquired): Secondary | ICD-10-CM | POA: Diagnosis not present

## 2023-03-06 DIAGNOSIS — Z79899 Other long term (current) drug therapy: Secondary | ICD-10-CM | POA: Insufficient documentation

## 2023-03-06 DIAGNOSIS — K754 Autoimmune hepatitis: Secondary | ICD-10-CM | POA: Diagnosis not present

## 2023-03-06 LAB — RESP PANEL BY RT-PCR (RSV, FLU A&B, COVID)  RVPGX2
Influenza A by PCR: NEGATIVE
Influenza B by PCR: NEGATIVE
Resp Syncytial Virus by PCR: NEGATIVE
SARS Coronavirus 2 by RT PCR: NEGATIVE

## 2023-03-06 LAB — BASIC METABOLIC PANEL
Anion gap: 9 (ref 5–15)
BUN: 14 mg/dL (ref 8–23)
CO2: 24 mmol/L (ref 22–32)
Calcium: 9.1 mg/dL (ref 8.9–10.3)
Chloride: 105 mmol/L (ref 98–111)
Creatinine, Ser: 0.65 mg/dL (ref 0.44–1.00)
GFR, Estimated: >60 mL/min (ref >60)
Glucose, Bld: 136 mg/dL — ABNORMAL HIGH (ref 70–99)
Potassium: 4.1 mmol/L (ref 3.5–5.1)
Sodium: 138 mmol/L (ref 135–145)

## 2023-03-06 LAB — URINALYSIS, ROUTINE W REFLEX MICROSCOPIC
Bilirubin Urine: NEGATIVE
Glucose, UA: NEGATIVE mg/dL
Hgb urine dipstick: NEGATIVE
Ketones, ur: 5 mg/dL — AB
Leukocytes,Ua: NEGATIVE
Nitrite: NEGATIVE
Protein, ur: NEGATIVE mg/dL
Specific Gravity, Urine: 1.012 (ref 1.005–1.030)
pH: 7 (ref 5.0–8.0)

## 2023-03-06 LAB — HEPATIC FUNCTION PANEL
ALT: 27 U/L (ref 0–44)
AST: 34 U/L (ref 15–41)
Albumin: 4.1 g/dL (ref 3.5–5.0)
Alkaline Phosphatase: 72 U/L (ref 38–126)
Bilirubin, Direct: 0.2 mg/dL (ref 0.0–0.2)
Indirect Bilirubin: 0.4 mg/dL (ref 0.3–0.9)
Total Bilirubin: 0.6 mg/dL (ref 0.3–1.2)
Total Protein: 8 g/dL (ref 6.5–8.1)

## 2023-03-06 LAB — TSH: TSH: 1.101 u[IU]/mL (ref 0.350–4.500)

## 2023-03-06 LAB — CBC
HCT: 44.5 % (ref 36.0–46.0)
Hemoglobin: 14 g/dL (ref 12.0–15.0)
MCH: 30 pg (ref 26.0–34.0)
MCHC: 31.5 g/dL (ref 30.0–36.0)
MCV: 95.3 fL (ref 80.0–100.0)
Platelets: 149 10*3/uL — ABNORMAL LOW (ref 150–400)
RBC: 4.67 MIL/uL (ref 3.87–5.11)
RDW: 14.2 % (ref 11.5–15.5)
WBC: 7.1 10*3/uL (ref 4.0–10.5)
nRBC: 0 % (ref 0.0–0.2)

## 2023-03-06 LAB — TROPONIN I (HIGH SENSITIVITY)
Troponin I (High Sensitivity): <2 ng/L (ref <18)
Troponin I (High Sensitivity): <2 ng/L (ref <18)

## 2023-03-06 LAB — MAGNESIUM: Magnesium: 2.1 mg/dL (ref 1.7–2.4)

## 2023-03-06 MED ORDER — SODIUM CHLORIDE 0.9% FLUSH
3.0000 mL | Freq: Two times a day (BID) | INTRAVENOUS | Status: DC
  Administered 2023-03-06 – 2023-03-07 (×3): 3 mL via INTRAVENOUS

## 2023-03-06 MED ORDER — ACETAMINOPHEN 650 MG RE SUPP: 650.0000 mg | Freq: Four times a day (QID) | RECTAL | Status: DC | PRN

## 2023-03-06 MED ORDER — GUAIFENESIN ER 600 MG PO TB12: 600.0000 mg | ORAL_TABLET | Freq: Two times a day (BID) | ORAL | Status: DC | PRN

## 2023-03-06 MED ORDER — AZATHIOPRINE 50 MG PO TABS
50.0000 mg | ORAL_TABLET | Freq: Every day | ORAL | Status: DC
  Administered 2023-03-06: 50 mg via ORAL
  Filled 2023-03-06: qty 1

## 2023-03-06 MED ORDER — ACETAMINOPHEN 325 MG PO TABS: 650.0000 mg | ORAL_TABLET | Freq: Four times a day (QID) | ORAL | Status: DC | PRN

## 2023-03-06 MED ORDER — ATORVASTATIN CALCIUM 10 MG PO TABS
10.0000 mg | ORAL_TABLET | Freq: Every day | ORAL | Status: DC
  Administered 2023-03-06: 10 mg via ORAL
  Filled 2023-03-06: qty 1

## 2023-03-06 MED ORDER — LACTATED RINGERS IV BOLUS
1000.0000 mL | Freq: Once | INTRAVENOUS | Status: AC
  Administered 2023-03-06: 1000 mL via INTRAVENOUS

## 2023-03-06 MED ORDER — DOXYCYCLINE HYCLATE 100 MG PO TABS
100.0000 mg | ORAL_TABLET | Freq: Once | ORAL | Status: AC
  Administered 2023-03-06: 100 mg via ORAL
  Filled 2023-03-06: qty 1

## 2023-03-06 MED ORDER — ENOXAPARIN SODIUM 40 MG/0.4ML IJ SOSY
40.0000 mg | PREFILLED_SYRINGE | INTRAMUSCULAR | Status: DC
  Administered 2023-03-06: 40 mg via SUBCUTANEOUS
  Filled 2023-03-06: qty 0.4

## 2023-03-06 MED ORDER — POLYETHYLENE GLYCOL 3350 17 G PO PACK: 17.0000 g | PACK | Freq: Every day | ORAL | Status: DC | PRN

## 2023-03-06 MED ORDER — ONDANSETRON HCL 4 MG PO TABS: 4.0000 mg | ORAL_TABLET | Freq: Four times a day (QID) | ORAL | Status: DC | PRN

## 2023-03-06 MED ORDER — ONDANSETRON HCL 4 MG/2ML IJ SOLN: 4.0000 mg | Freq: Four times a day (QID) | INTRAMUSCULAR | Status: DC | PRN

## 2023-03-06 MED ORDER — LEVOTHYROXINE SODIUM 50 MCG PO TABS
50.0000 ug | ORAL_TABLET | Freq: Every day | ORAL | Status: DC
  Administered 2023-03-07: 50 ug via ORAL
  Filled 2023-03-06: qty 1

## 2023-03-06 NOTE — Assessment & Plan Note (Signed)
-   Continue home Synthroid - TSH pending 

## 2023-03-06 NOTE — ED Notes (Signed)
Orthostatics  Laying BP 138/76  HR  79 Sitting BP 130/89 HR 88 Standing BP 126/78 HR 91

## 2023-03-06 NOTE — Assessment & Plan Note (Addendum)
Patient is presenting with 1 day history of likely syncopal and presyncopal events with approximately several seconds prodrome of dizziness.  No focal neurological findings to suggest CVA; CT head was negative for any evidence of bleeding.  EKG did demonstrate QTc prolongation initially but then QTc normalized.  No cardiac history. No electrolyte or hematological abnormalities to explain.  Orthostatics negative on arrival.  - Telemetry monitoring; patient may benefit from prolonged monitoring on discharge - Echocardiogram ordered - S/p 1 L of LR - TSH pending

## 2023-03-06 NOTE — H&P (Addendum)
History and Physical    Patient: Krista Olson ZOX:[id number] DOB: [birth year] DOA: 03/06/2023 DOS: the patient was seen and examined on 03/06/2023 PCP: Sherlene Shams, MD  Patient coming from: Home  Chief Complaint:  Chief Complaint  Patient presents with   Loss of Consciousness   HPI: Krista Olson is a 67 y.o. 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  Krista Olson states that she was in her normal state of health this morning when she went to the kitchen to start making some coffee.  At 1 point, she had approximately 5 seconds of dizziness before she believes she either lost consciousness or nearly lost consciousness.  She had a vertical fall down to the ground and did hit the back of her head on the ground.  She believes she may have been out for less than 5 seconds.  Her husband was in the next room and came into check on her, in addition to slowly helping her get up.  He checked her for focal weakness at that time and did not note any.  She was able to walk to her bedroom and took a nap.  When woke up and walk to the kitchen, she noted dizziness once again but did not lose consciousness or fall.  She had additional episode of dizziness when standing with EMS.  She denies any palpitations, chest pain, shortness of breath, vision changes or focal weakness.  She denies any prior similar events.  She notes that she has been experiencing upper respiratory symptoms for the last 3 days including runny nose, nasal congestion, postnasal drip and cough.  She denies any fever, chills.  She denies any nausea, vomiting, diarrhea, abdominal pain or recent fasting.  ED course: On arrival to the ED, patient was normotensive at 131/81 with heart rate of 80.  She was afebrile at 98.3.  She was saturating at 97% on room air.  Initial workup notable for WBC of 7.0, hemoglobin 14.0,  platelets 149, potassium 4.1, bicarb 24, glucose 136, creatinine 0.65, calcium 9.1, magnesium 2.1, AST 34, ALT 27 and GFR above 60.  Troponin negative x 2.  COVID-19, influenza and RSV PCR negative.  Urinalysis negative for leukocytes or nitrites, positive for ketones.  CT of the head was obtained that did not show any acute intracranial abnormality.  Chest x-ray was obtained that demonstrated no active cardiopulmonary disease.  Initial EKG obtained demonstrated borderline QT prolongation at 577.  Repeat EKG demonstrated normalization of QTc at 469.  TRH contacted for admission for unexplained syncope.  Review of Systems: As mentioned in the history of present illness. All other systems reviewed and are negative.  Past Medical History:  Diagnosis Date   Hepatitis, autoimmune (HCC)    Thyroid disease    Past Surgical History:  Procedure Laterality Date   PERCUTANEOUS LIVER BIOPSY     Social History:  reports that she has never smoked. She has never used smokeless tobacco. She reports current alcohol use of about 5.0 standard drinks of alcohol per week. She reports that she does not use drugs.  No Known Allergies  Family History  Problem Relation Age of Onset   Goiter Mother    Heart disease Mother 61       died during valve replacement   Kidney disease Father    Cancer Brother 24       renal cell Ca  Heart disease Brother        CARDIAC AREEST DURING SURGERY    Cancer Maternal Aunt 83       breast cancer , 1of 7 sisters only   Breast cancer Maternal Aunt 50    Prior to Admission medications   Medication Sig Start Date End Date Taking? Authorizing Provider  atorvastatin (LIPITOR) 10 MG tablet TAKE 1 TABLET BY MOUTH AT BEDTIME 02/26/23  Yes Sherlene Shams, MD  azaTHIOprine (IMURAN) 50 MG tablet Take 1 tablet (50 mg total) by mouth daily. 11/03/17  Yes Sherlene Shams, MD  Azelaic Acid 15 % gel After skin is thoroughly washed and patted dry, gently but thoroughly massage a thin film  of azelaic acid cream into the affected area twice daily, in the morning and evening. Patient taking differently: Apply 1 application  topically 2 (two) times daily as needed. After skin is thoroughly washed and patted dry, gently but thoroughly massage a thin film of azelaic acid cream into the affected area twice daily, in the morning and evening. 12/22/22  Yes Willeen Niece, MD  Calcium Carbonate-Vitamin D (CALCIUM + D PO) Take 1 tablet by mouth daily.   Yes [provider]  levothyroxine (SYNTHROID) 50 MCG tablet TAKE 1 TABLET BY MOUTH ONCE DAILY ON AN EMPTY STOMACH. WAIT 30 MINUTES BEFORE TAKING OTHER MEDS. 09/18/22  Yes Tukov-Yual, Alroy Bailiff, NP  Oxymetazoline HCl (RHOFADE) 1 % CREA Apply topically to aa of face for rosacea in the morning Patient taking differently: Apply 1 application  topically daily as needed. Apply topically to aa of face for rosacea in the morning 12/22/22  Yes Willeen Niece, MD  tretinoin (RETIN-A) 0.05 % cream Apply topically a  pea sized amount to face qhs as tolerated. Patient taking differently: Apply 1 application  topically daily as needed. Apply topically a  pea sized amount to face qhs as tolerated. 12/22/22  Yes Willeen Niece, MD    Physical Exam: Vitals:   03/06/23 1032 03/06/23 1033 03/06/23 1037 03/06/23 1100  BP:   (!) 108/94 131/81  Pulse:  83  82  Resp:  18  19  Temp:  97.8 F (36.6 C)  98.3 F (36.8 C)  TempSrc:    Oral  SpO2:  97%  97%  Weight: 61.2 kg     Height:  (1.575 m)      Physical Exam Vitals and nursing note reviewed.  Constitutional:      General: She is not in acute distress.    Appearance: She is normal weight. She is not toxic-appearing.  HENT:     Head: Normocephalic and atraumatic.     Nose: Congestion present.     Mouth/Throat:     Mouth: Mucous membranes are moist.  Eyes:     Conjunctiva/sclera: Conjunctivae normal.     Pupils: Pupils are equal, round, and reactive to light.  Neck:     Vascular: No  carotid bruit.  Cardiovascular:     Rate and Rhythm: Normal rate and regular rhythm.     Heart sounds: Murmur (2/6 decrescendo systolic murmur heard best at the right upper sternal border.) heard.  Pulmonary:     Effort: Pulmonary effort is normal. No respiratory distress.     Breath sounds: Normal breath sounds. No wheezing, rhonchi or rales.  Abdominal:     General: Bowel sounds are normal. There is no distension.     Palpations: Abdomen is soft.     Tenderness: There is no abdominal  tenderness. There is no guarding.  Musculoskeletal:     Right lower leg: No edema.     Left lower leg: No edema.  Skin:    General: Skin is warm and dry.  Neurological:     Mental Status: She is alert.     Comments:  Patient is alert and oriented x 3 No facial asymmetry or dysarthria Moving all extremities independently  Psychiatric:        Mood and Affect: Mood normal.        Behavior: Behavior normal.    Data Reviewed: CBC with WBC of 7.1, hemoglobin 14.0, MCV 95 and platelets of 149 CMP with sodium of 138, potassium 4.1, chloride 105, bicarb 24, glucose 136, BUN 14, creatinine 0.65, anion gap 9, calcium 9.1, AST 34, ALT 27 and GFR above 60 Magnesium within normal limits at 2.1 Troponin negative x 2 COVID-19, influenza and RSV PCR negative Urinalysis with ketones only  Initial EKG personally reviewed.  Sinus rhythm with rate of 83.  Calculated QTc of 577.  Left axis deviation.  Singular PVC. Second EKG personally reviewed sinus rhythm with rate of 85.  Left axis deviation.  QTc of 469.  CT HEAD WO CONTRAST ( )  Result Date: 03/06/2023 CLINICAL DATA:  Syncope with head trauma. EXAM: CT HEAD WITHOUT CONTRAST TECHNIQUE: Contiguous axial images were obtained from the base of the skull through the vertex without intravenous contrast. RADIATION DOSE REDUCTION: This exam was performed according to the departmental dose-optimization program which includes automated exposure control, adjustment of  the mA and/or kV according to patient size and/or use of iterative reconstruction technique. COMPARISON:  None Available. FINDINGS: Brain: No evidence of acute infarction, hemorrhage, hydrocephalus, extra-axial collection or mass lesion/mass effect. Vascular: No hyperdense vessel or unexpected calcification. Skull: Normal. Negative for fracture or focal lesion. Sinuses/Orbits: No acute finding. IMPRESSION: No evidence of intracranial injury. Electronically Signed   By: Tiburcio Pea M.D.   On: 03/06/2023 11:22   DG Chest 2 View  Result Date: 03/06/2023 CLINICAL DATA:  Syncope, cough. EXAM: CHEST - 2 VIEW COMPARISON:  None Available. FINDINGS: The heart size and mediastinal contours are within normal limits. Both lungs are clear. No visible pleural effusions or pneumothorax. No acute osseous abnormality. IMPRESSION: No active cardiopulmonary disease. Electronically Signed   By: Feliberto Harts M.D.   On: 03/06/2023 11:19    There are no new results to review at this time.  Assessment and Plan:  * Syncope Patient is presenting with 1 day history of likely syncopal and presyncopal events with approximately several seconds prodrome of dizziness.  No focal neurological findings to suggest CVA; CT head was negative for any evidence of bleeding.  EKG did demonstrate QTc prolongation initially but then QTc normalized.  No cardiac history. No electrolyte or hematological abnormalities to explain.  Orthostatics negative on arrival.  - Telemetry monitoring; patient may benefit from prolonged monitoring on discharge - Echocardiogram ordered - S/p 1 L of LR - TSH pending  URI (upper respiratory infection) Patient endorses 3-day history of rhinorrhea, postnasal drip, nasal congestion, and cough.  Likely viral in nature.  Received one-time dose of doxycycline in the ED.  - Supportive management only  Hypothyroidism - Continue home Synthroid - TSH pending  Autoimmune hepatitis - Continue home  Imuran  Advance Care Planning:   Code Status: Full Code verified by patient  Consults: None  Family Communication: Patient's husband updated at bedside  Severity of Illness: The appropriate patient status for this patient  is OBSERVATION. Observation status is judged to be reasonable and necessary in order to provide the required intensity of service to ensure the patient's safety. The patient's presenting symptoms, physical exam findings, and initial radiographic and laboratory data in the context of their medical condition is felt to place them at decreased risk for further clinical deterioration. Furthermore, it is anticipated that the patient will be medically stable for discharge from the hospital within 2 midnights of admission.   Author: Verdene Lennert, MD 03/06/2023 3:40 PM  For on call review [email or web address].

## 2023-03-06 NOTE — ED Triage Notes (Signed)
Pt to ED [facility] from home for syncope x2 this am. States did hit head. Denies blood thinners. +nausea. Reports feels unwell when sitting or standing. Alert and oriented. NAD noted

## 2023-03-06 NOTE — Assessment & Plan Note (Signed)
-   Continue home Imuran

## 2023-03-06 NOTE — ED Provider Notes (Signed)
[facility] Provider Note    Event Date/Time   First MD Initiated Contact with Patient 03/06/23 1033     (approximate)   History   Loss of Consciousness   HPI  Krista Olson is a 67 y.o. female with hypothyroidism, autoimmune hepatitis who comes in with concerns for syncopal episode.  Patient reports for the past few days she has had little bit of congestion.  She reports maybe not eating and drinking as much as she should.  She states that she has been blowing her nose and not.  She states that she woke up this morning and went to the bathroom when she then felt lightheaded and had a syncopal episode.  She did report hitting the back of her head.  Denies any neck pain.  She states that she was on the ground a brief amount of time when her husband helped get her up.  Positive brief LOC.  She does report that she then went to bed for about 30 minutes and got up again was feeling better but when walking to the bathroom again she started to feel lightheaded a second time.  This time she did not blackout but sat down on the ground and her husband called EMS.  She denies any history of blacking out previously denies any chest pain, shortness of breath, abdominal pain or any dizziness at this time.  However she states that if she stood up she would probably feel bad again.   Physical Exam   Triage Vital Signs: ED Triage Vitals  Enc Vitals Group     BP --      Pulse Rate 03/06/23 1033 83     Resp 03/06/23 1033 18     Temp 03/06/23 1033 97.8 F (36.6 C)     Temp src --      SpO2 03/06/23 1033 97 %     Weight 03/06/23 1032 135 lb (61.2 kg)     Height 03/06/23 1032 5\' 2"  (1.575 m)     Head Circumference --      Peak Flow --      Pain Score 03/06/23 1032 0     Pain Loc --      Pain Edu? --      Excl. in GC? --     Most recent vital signs: Vitals:   03/06/23 1033  Pulse: 83  Resp: 18  Temp: 97.8 F (36.6 C)  SpO2: 97%     General: Awake, no distress.   CV:  Good peripheral perfusion.  Resp:  Normal effort.  Abd:  No distention.  Soft nontender Other:  No C-spine tenderness.  Hematoma on the back of the head.  Full range of motion of neck.  No numbness, no weakness in the extremities.   ED Results / Procedures / Treatments   Labs (all labs ordered are listed, but only abnormal results are displayed) Labs Reviewed  BASIC METABOLIC PANEL  CBC  URINALYSIS, ROUTINE W REFLEX MICROSCOPIC  HEPATIC FUNCTION PANEL  TROPONIN I (HIGH SENSITIVITY)     EKG  My interpretation of EKG:  Normal sinus rate of 83, no st elevation, no twi, qtc long 577, occasional PVC   Normal sinus rate of 85 without any ST elevation or T wave inversions, QTc 469  RADIOLOGY I have reviewed the CT had personally and interpreted and no ICH    PROCEDURES:  Critical Care performed: No  .1-3 Lead EKG Interpretation  Performed by: Artis Delay  E, MD Authorized by: Concha Se, MD     Interpretation: normal     ECG rate:  80   ECG rate assessment: normal     Rhythm: sinus rhythm     Ectopy: none     Conduction: normal      MEDICATIONS ORDERED IN ED: Medications  lactated ringers bolus 1,000 mL (0 mLs Intravenous Stopped 03/06/23 1259)     IMPRESSION / MDM / ASSESSMENT AND PLAN / ED COURSE  I reviewed the triage vital signs and the nursing notes.   Patient's presentation is most consistent with acute presentation with potential threat to life or bodily function.   Patient comes in with 2 syncopal episodes.  Could be from dehydration however orthostatics were negative.  Patient will get labs to evaluate for arrhythmia, EKG to evaluate for arrhythmia lab work to evaluate for any dehydration CT head to rule intracranial hemorrhage.  Based upon Nexus criteria for C-spine she is cleared  Initial EKG slightly prolonged QTc but upon repeat it is gone back to normal.  UA with slight ketones.  COVID, flu were negative.  BMP reassuring CBC slightly low  platelets.  Hepatic function normal initial troponin negative magnesium normal  Reevaluated patient.  Patient reports feeling better but still not at her baseline self even with fluids and orthostatics being negative.  We discussed admission versus going home.  Will admit patient for syncopal workup.  The patient is on the cardiac monitor to evaluate for evidence of arrhythmia and/or significant heart rate changes.      FINAL CLINICAL IMPRESSION(S) / ED DIAGNOSES   Final diagnoses:  Syncope and collapse     Rx / DC Orders   ED Discharge Orders     None        Note:  This document was prepared using Dragon voice recognition software and may include unintentional dictation errors.   Concha Se, MD 03/06/23 [id number]

## 2023-03-06 NOTE — Assessment & Plan Note (Signed)
Patient endorses 3-day history of rhinorrhea, postnasal drip, nasal congestion, and cough.  Likely viral in nature.  Received one-time dose of doxycycline in the ED.  - Supportive management only

## 2023-03-07 ENCOUNTER — Encounter: Payer: Self-pay | Admitting: Internal Medicine

## 2023-03-07 ENCOUNTER — Observation Stay (HOSPITAL_BASED_OUTPATIENT_CLINIC_OR_DEPARTMENT_OTHER)
Admit: 2023-03-07 | Discharge: 2023-03-07 | Disposition: A | Discharge status: Home / Self Care | Payer: Medicare HMO | Attending: Internal Medicine | Admitting: Internal Medicine

## 2023-03-07 DIAGNOSIS — R55 Syncope and collapse: Secondary | ICD-10-CM

## 2023-03-07 LAB — BASIC METABOLIC PANEL
Anion gap: 7 (ref 5–15)
BUN: 8 mg/dL (ref 8–23)
CO2: 24 mmol/L (ref 22–32)
Calcium: 8.6 mg/dL — ABNORMAL LOW (ref 8.9–10.3)
Chloride: 105 mmol/L (ref 98–111)
Creatinine, Ser: 0.59 mg/dL (ref 0.44–1.00)
GFR, Estimated: >60 mL/min (ref >60)
Glucose, Bld: 108 mg/dL — ABNORMAL HIGH (ref 70–99)
Potassium: 3.3 mmol/L — ABNORMAL LOW (ref 3.5–5.1)
Sodium: 136 mmol/L (ref 135–145)

## 2023-03-07 LAB — HIV ANTIBODY (ROUTINE TESTING W REFLEX): HIV Screen 4th Generation wRfx: NONREACTIVE

## 2023-03-07 LAB — ECHOCARDIOGRAM COMPLETE
AR max vel: 1.73 cm2
AV Peak grad: 11.3 mmHg
Ao pk vel: 1.68 m/s
Area-P 1/2: 3.46 cm2
Height: 62 in
S' Lateral: 2.4 cm
Weight: 2073.6 oz

## 2023-03-07 MED ORDER — POTASSIUM CHLORIDE CRYS ER 20 MEQ PO TBCR
40.0000 meq | EXTENDED_RELEASE_TABLET | ORAL | Status: DC
  Administered 2023-03-07: 40 meq via ORAL
  Filled 2023-03-07: qty 2

## 2023-03-07 NOTE — ED Notes (Signed)
Report received from Tracey, RN.

## 2023-03-07 NOTE — Progress Notes (Signed)
  Echocardiogram 2D Echocardiogram has been performed.  Krista Olson 03/07/2023, 12:13 PM

## 2023-03-07 NOTE — Discharge Summary (Signed)
Physician Discharge Summary   Patient: Krista Olson MRN: [id number] DOB: June 05, 1956  Admit date:     03/06/2023  Discharge date: 03/07/23  Discharge Physician: Loyce Dys   PCP: Sherlene Shams, MD   Discharge Diagnoses: Syncope URI (upper respiratory infection) Hypothyroidism Autoimmune hepatitis   Hospital Course:  Krista Olson is a 67 y.o. 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  Krista Olson states that she was in her normal state of health the morning of presentation when she went to the kitchen to start making some coffee.  At 1 point, she had approximately 5 seconds of dizziness before she believes she either lost consciousness or nearly lost consciousness.  She had a vertical fall down to the ground and did hit the back of her head on the ground.  Patient was monitored on telemetry with no acute findings.  Echocardiogram has been done which showed normal results.  Patient underwent orthostatic blood pressure check that was within normal limits after IV fluid therapy.  Therefore being discharged today to follow-up with primary care physician.  Consultants: None Procedures performed: None Disposition: Home Diet recommendation:  Discharge Diet Orders (From admission, onward)     Start     Ordered   03/07/23 0000  Diet - low sodium heart healthy        03/07/23 1408           Cardiac diet DISCHARGE MEDICATION: Allergies as of 03/07/2023   No Known Allergies      Medication List     TAKE these medications    atorvastatin 10 MG tablet Commonly known as: LIPITOR TAKE 1 TABLET BY MOUTH AT BEDTIME   azaTHIOprine 50 MG tablet Commonly known as: IMURAN Take 1 tablet (50 mg total) by mouth daily.   Azelaic Acid 15 % gel After skin is thoroughly washed and patted dry, gently but thoroughly massage a thin film of azelaic acid cream into the  affected area twice daily, in the morning and evening. What changed:  how much to take how to take this when to take this reasons to take this   CALCIUM + D PO Take 1 tablet by mouth daily.   levothyroxine 50 MCG tablet Commonly known as: SYNTHROID TAKE 1 TABLET BY MOUTH ONCE DAILY ON AN EMPTY STOMACH. WAIT 30 MINUTES BEFORE TAKING OTHER MEDS.   Rhofade 1 % Crea Generic drug: Oxymetazoline HCl Apply topically to aa of face for rosacea in the morning What changed:  how much to take how to take this when to take this reasons to take this   tretinoin 0.05 % cream Commonly known as: RETIN-A Apply topically a  pea sized amount to face qhs as tolerated. What changed:  how much to take how to take this when to take this reasons to take this        Discharge Exam: Filed Weights   03/06/23 1032 03/07/23 0548  Weight: 61.2 kg 58.8 kg   Vitals and nursing note reviewed.  Constitutional:      General: She is not in acute distress. HENT:     Head: Normocephalic and atraumatic.  Eyes:     Conjunctiva/sclera: Conjunctivae normal.     Pupils: Pupils are equal, round, and reactive to light.  Neck:     Vascular: No carotid bruit.  Cardiovascular:     Rate and Rhythm: Normal rate and regular  rhythm.  Pulmonary:     Effort: Pulmonary effort is normal. No respiratory distress.  Skin:    General: Skin is warm and dry.  Neurological:     Mental Status: She is alert  Condition at discharge: good    Discharge time spent: greater than 30 minutes.  Signed: Loyce Dys, MD [facility] 03/07/2023

## 2023-03-08 ENCOUNTER — Telehealth: Payer: Self-pay

## 2023-03-08 ENCOUNTER — Encounter: Payer: Self-pay | Admitting: Internal Medicine

## 2023-03-08 NOTE — Transitions of Care (Post Inpatient/ED Visit) (Signed)
   03/08/2023  Name: Krista Olson MRN: [id number] DOB: 02/24/1956  Today's TOC FU Call Status: Today's TOC FU Call Status:: Unsuccessul Call (1st Attempt) Unsuccessful Call (1st Attempt) Date: 03/08/23  Attempted to reach the patient regarding the most recent Inpatient/ED visit.  Follow Up Plan: Additional outreach attempts will be made to reach the patient to complete the Transitions of Care (Post Inpatient/ED visit) call.   Signature Karena Addison, LPN [facility] Nurse Health Advisor Direct Dial [phone]

## 2023-03-08 NOTE — Telephone Encounter (Signed)
Spoke with pt and moved her appt up to tomorrow.

## 2023-03-08 NOTE — Telephone Encounter (Signed)
Pt returned the call to the office and was transferred to the person in which called her.

## 2023-03-09 ENCOUNTER — Encounter: Payer: Self-pay | Admitting: Internal Medicine

## 2023-03-09 ENCOUNTER — Ambulatory Visit: Payer: Medicare HMO | Attending: Internal Medicine

## 2023-03-09 ENCOUNTER — Ambulatory Visit (INDEPENDENT_AMBULATORY_CARE_PROVIDER_SITE_OTHER): Payer: Medicare HMO | Admitting: Internal Medicine

## 2023-03-09 VITALS — BP 118/82 | HR 67 | Temp 98.0°F | Ht 62.0 in | Wt 128.2 lb

## 2023-03-09 DIAGNOSIS — H6690 Otitis media, unspecified, unspecified ear: Secondary | ICD-10-CM | POA: Diagnosis not present

## 2023-03-09 DIAGNOSIS — M810 Age-related osteoporosis without current pathological fracture: Secondary | ICD-10-CM | POA: Diagnosis not present

## 2023-03-09 DIAGNOSIS — R55 Syncope and collapse: Secondary | ICD-10-CM

## 2023-03-09 MED ORDER — AMOXICILLIN-POT CLAVULANATE 875-125 MG PO TABS
1.0000 | ORAL_TABLET | Freq: Two times a day (BID) | ORAL | 0 refills | Status: DC
Start: 2023-03-09 — End: 2023-09-06

## 2023-03-09 MED ORDER — RALOXIFENE HCL 60 MG PO TABS: 60.0000 mg | ORAL_TABLET | Freq: Every day | ORAL | 2 refills | Status: DC

## 2023-03-09 MED ORDER — PREDNISONE 10 MG PO TABS: ORAL_TABLET | ORAL | 0 refills | Status: DC

## 2023-03-09 MED ORDER — DOXYCYCLINE HYCLATE 100 MG PO TABS: 100.0000 mg | ORAL_TABLET | Freq: Two times a day (BID) | ORAL | 0 refills | Status: DC

## 2023-03-09 NOTE — Progress Notes (Signed)
Subjective:  Patient ID: Krista Olson, female    DOB: [birth year]  Age: 67 y.o. MRN: [id number]  CC: The primary encounter diagnosis was Syncope, unspecified syncope type. Diagnoses of Acute otitis media, unspecified otitis media type and Osteoporosis without current pathological fracture, unspecified osteoporosis type were also pertinent to this visit.   HPI [name] presents for follow up on multiple issues  Chief Complaint  Patient presents with   Medical Management of Chronic Issues   Nasal Congestion    Drainage, cough, HA and sinus congestion x 1 week    Sore throat,  drainage,  congestion  headache for the past week ,  sinus drainage green.   No improvement . On Imuran for autoimmune hepatitis.   First Syncopal event occurred on Saturday while making coffee,   had 5 seconds of feeling light headed and vision started to go dark.  No history of diarrhea or other activity that would cause dehydration.  Had been taking a decongestant .   taken to ER . Told she was dehydrated  Osteoporosis:  by recent DEXA.  T score -3.1  no history of fractures   Outpatient Medications Prior to Visit  Medication Sig Dispense Refill   atorvastatin (LIPITOR) 10 MG tablet TAKE 1 TABLET BY MOUTH AT BEDTIME 90 tablet 1   azaTHIOprine (IMURAN) 50 MG tablet Take 1 tablet (50 mg total) by mouth daily. 30 tablet 5   Azelaic Acid 15 % gel After skin is thoroughly washed and patted dry, gently but thoroughly massage a thin film of azelaic acid cream into the affected area twice daily, in the morning and evening. (Patient taking differently: Apply 1 application  topically 2 (two) times daily as needed. After skin is thoroughly washed and patted dry, gently but thoroughly massage a thin film of azelaic acid cream into the affected area twice daily, in the morning and evening.) 50 g 5   Calcium Carbonate-Vitamin D (CALCIUM + D PO) Take 1 tablet by mouth daily.     levothyroxine (SYNTHROID) 50 MCG tablet TAKE 1  TABLET BY MOUTH ONCE DAILY ON AN EMPTY STOMACH. WAIT 30 MINUTES BEFORE TAKING OTHER MEDS. 90 tablet 1   Oxymetazoline HCl (RHOFADE) 1 % CREA Apply topically to aa of face for rosacea in the morning (Patient taking differently: Apply 1 application  topically daily as needed. Apply topically to aa of face for rosacea in the morning) 30 g 1   tretinoin (RETIN-A) 0.05 % cream Apply topically a  pea sized amount to face qhs as tolerated. (Patient taking differently: Apply 1 application  topically daily as needed. Apply topically a  pea sized amount to face qhs as tolerated.) 45 g 11   No facility-administered medications prior to visit.    Review of Systems;  Patient denies headache, fevers, malaise, unintentional weight loss, skin rash, eye pain, sinus congestion and sinus pain, sore throat, dysphagia,  hemoptysis , cough, dyspnea, wheezing, chest pain, palpitations, orthopnea, edema, abdominal pain, nausea, melena, diarrhea, constipation, flank pain, dysuria, hematuria, urinary  Frequency, nocturia, numbness, tingling, seizures,  Focal weakness, Loss of consciousness,  Tremor, insomnia, depression, anxiety, and suicidal ideation.      Objective:  BP 118/82   Pulse 67   Temp 98 F (36.7 C) (Oral)   Ht 5\' 2"  (1.575 m)   Wt 128 lb 3.2 oz (58.2 kg)   SpO2 96%   BMI 23.45 kg/m   BP Readings from Last 3 Encounters:  03/09/23 118/82  03/07/23 116/78  12/22/22 129/85    Wt Readings from Last 3 Encounters:  03/09/23 128 lb 3.2 oz (58.2 kg)  03/07/23 129 lb 9.6 oz (58.8 kg)  10/07/22 132 lb 6.4 oz (60.1 kg)    Physical Exam Vitals reviewed.  Constitutional:      General: She is not in acute distress.    Appearance: Normal appearance. She is normal weight. She is not ill-appearing, toxic-appearing or diaphoretic.  HENT:     Head: Normocephalic.     Ears:     Comments: Right TM injected  Eyes:     General: No scleral icterus.       Right eye: No discharge.        Left eye: No  discharge.     Conjunctiva/sclera: Conjunctivae normal.  Cardiovascular:     Rate and Rhythm: Normal rate and regular rhythm.     Heart sounds: Normal heart sounds.  Pulmonary:     Effort: Pulmonary effort is normal. No respiratory distress.     Breath sounds: Normal breath sounds.  Musculoskeletal:        General: Normal range of motion.  Skin:    General: Skin is warm and dry.  Neurological:     General: No focal deficit present.     Mental Status: She is alert and oriented to person, place, and time. Mental status is at baseline.  Psychiatric:        Mood and Affect: Mood normal.        Behavior: Behavior normal.        Thought Content: Thought content normal.        Judgment: Judgment normal.    Lab Results  Component Value Date   HGBA1C 6.2 12/15/2022   HGBA1C 6.1 07/30/2021   HGBA1C 6.0 02/04/2021    Lab Results  Component Value Date   CREATININE 0.59 03/07/2023   CREATININE 0.65 03/06/2023   CREATININE 0.68 12/15/2022    Lab Results  Component Value Date   WBC 7.1 03/06/2023   HGB 14.0 03/06/2023   HCT 44.5 03/06/2023   PLT 149 (L) 03/06/2023   GLUCOSE 108 (H) 03/07/2023   CHOL 219 (H) 12/15/2022   TRIG 119.0 12/15/2022   HDL 64.10 12/15/2022   LDLDIRECT 134.0 12/15/2022   LDLCALC 131 (H) 12/15/2022   ALT 27 03/06/2023   AST 34 03/06/2023   NA 136 03/07/2023   K 3.3 (L) 03/07/2023   CL 105 03/07/2023   CREATININE 0.59 03/07/2023   BUN 8 03/07/2023   CO2 24 03/07/2023   TSH 1.101 03/06/2023   INR 1.0 08/05/2022   HGBA1C 6.2 12/15/2022    ECHOCARDIOGRAM COMPLETE  Result Date: 03/07/2023    ECHOCARDIOGRAM REPORT   Patient Name:   Krista Olson Date of Exam: 03/07/2023 Medical Rec #:  [id number]    Height:       62.0 in Accession #:    [id number]   Weight:       129.6 lb Date of Birth:  1956-06-27    BSA:          1.590 m Patient Age:    66 years     BP:           129/91 mmHg Patient Gender: F            HR:           81 bpm. Exam Location:  [facility]  Procedure: 2D Echo Indications:     Syncope R55  History:         Patient has no prior history of Echocardiogram examinations.  Sonographer:     Overton Mam RDCS Referring Phys:  [id number] Verdene Lennert Diagnosing Phys: Chilton Si MD IMPRESSIONS  1. Left ventricular ejection fraction, by estimation, is 60 to 65%. The left ventricle has normal function. The left ventricle has no regional wall motion abnormalities. Left ventricular diastolic parameters are consistent with Grade I diastolic dysfunction (impaired relaxation).  2. Right ventricular systolic function is normal. The right ventricular size is normal. There is normal pulmonary artery systolic pressure.  3. The mitral valve is normal in structure. Trivial mitral valve regurgitation. No evidence of mitral stenosis.  4. The aortic valve is tricuspid. Aortic valve regurgitation is not visualized. No aortic stenosis is present.  5. The inferior vena cava is normal in size with greater than 50% respiratory variability, suggesting right atrial pressure of 3 mmHg. FINDINGS  Left Ventricle: Left ventricular ejection fraction, by estimation, is 60 to 65%. The left ventricle has normal function. The left ventricle has no regional wall motion abnormalities. The left ventricular internal cavity size was normal in size. There is  no left ventricular hypertrophy. Left ventricular diastolic parameters are consistent with Grade I diastolic dysfunction (impaired relaxation). Indeterminate filling pressures. Right Ventricle: The right ventricular size is normal. No increase in right ventricular wall thickness. Right ventricular systolic function is normal. There is normal pulmonary artery systolic pressure. The tricuspid regurgitant velocity is 2.23 m/s, and  with an assumed right atrial pressure of 3 mmHg, the estimated right ventricular systolic pressure is 22.9 mmHg. Left Atrium: Left atrial size was normal in size. Right Atrium: Right atrial size was normal in  size. Pericardium: There is no evidence of pericardial effusion. Mitral Valve: The mitral valve is normal in structure. Trivial mitral valve regurgitation. No evidence of mitral valve stenosis. Tricuspid Valve: The tricuspid valve is normal in structure. Tricuspid valve regurgitation is mild . No evidence of tricuspid stenosis. Aortic Valve: The aortic valve is tricuspid. Aortic valve regurgitation is not visualized. No aortic stenosis is present. Aortic valve peak gradient measures 11.3 mmHg. Pulmonic Valve: The pulmonic valve was normal in structure. Pulmonic valve regurgitation is mild. No evidence of pulmonic stenosis. Aorta: The aortic root is normal in size and structure. Venous: The inferior vena cava is normal in size with greater than 50% respiratory variability, suggesting right atrial pressure of 3 mmHg. IAS/Shunts: No atrial level shunt detected by color flow Doppler.  LEFT VENTRICLE PLAX 2D LVIDd:         3.50 cm   Diastology LVIDs:         2.40 cm   LV e' medial:    7.94 cm/s LV PW:         0.90 cm   LV E/e' medial:  11.8 LV IVS:        1.00 cm   LV e' lateral:   10.70 cm/s LVOT diam:     1.80 cm   LV E/e' lateral: 8.7 LV SV:         59 LV SV Index:   37 LVOT Area:     2.54 cm  RIGHT VENTRICLE RV Basal diam:  2.30 cm RV S prime:     20.40 cm/s TAPSE (M-mode): 2.5 cm LEFT ATRIUM             Index       RIGHT ATRIUM  Index LA diam:        1.80 cm 1.13 cm/m  RA Area:     11.50 cm LA Vol (A2C):   13.8 ml 8.68 ml/m  RA Volume:   28.60 ml  17.99 ml/m LA Vol (A4C):   13.0 ml 8.18 ml/m LA Biplane Vol: 13.7 ml 8.62 ml/m  AORTIC VALVE                 PULMONIC VALVE AV Area (Vmax): 1.73 cm     PV Vmax:       0.72 m/s AV Vmax:        168.00 cm/s  PV Peak grad:  2.1 mmHg AV Peak Grad:   11.3 mmHg LVOT Vmax:      114.00 cm/s LVOT Vmean:     67.900 cm/s LVOT VTI:       0.232 m  AORTA Ao Root diam: 3.00 cm Ao Asc diam:  2.90 cm MITRAL VALVE               TRICUSPID VALVE MV Area (PHT): 3.46 cm    TV  Peak grad:   18.5 mmHg MV Decel Time: 219 msec    TV Vmax:        2.15 m/s MV E velocity: 93.30 cm/s  TR Peak grad:   19.9 mmHg MV A velocity: 96.40 cm/s  TR Vmax:        223.00 cm/s MV E/A ratio:  0.97                            SHUNTS                            Systemic VTI:  0.23 m                            Systemic Diam: 1.80 cm Chilton Si MD Electronically signed by Chilton Si MD Signature Date/Time: 03/07/2023/1:11:21 PM    Final     Assessment & Plan:  .Syncope, unspecified syncope type Assessment & Plan: Etiology presumed to be orthostasis by ER physician but not clear to me given her history.  ECHO reviewed: normal.  ZIO monitor ordered   Orders: -     LONG TERM MONITOR (3-14 DAYS); Future  Acute otitis media, unspecified otitis media type Assessment & Plan: Secondary to prlonged sinus congestion .  Augmentin and prednisone given immunosuppression   Osteoporosis without current pathological fracture, unspecified osteoporosis type Assessment & Plan: Reviewed recent DEXA.  Calcium and vitamin D requirements and available options for treatment.  Starting Evista     Other orders -     Doxycycline Hyclate; Take 1 tablet (100 mg total) by mouth 2 (two) times daily.  Dispense: 14 tablet; Refill: 0 -     Raloxifene HCl; Take 1 tablet (60 mg total) by mouth daily.  Dispense: 30 tablet; Refill: 2 -     Amoxicillin-Pot Clavulanate; Take 1 tablet by mouth 2 (two) times daily.  Dispense: 14 tablet; Refill: 0 -     predniSONE; 6 tablets on Day 1 , then reduce by 1 tablet daily until gone  Dispense: 21 tablet; Refill: 0     I provided 45 minutes of face-to-face time during this encounter reviewing patient's  recent ER visit,  incluciend EKG and ECHO, previous   labs and imaging studies, counseling on  osteoporosis , management,   and post visit ordering to diagnostics and therapeutics .   Follow-up: Return in about 6 months (around 09/08/2023).   Sherlene Shams, MD

## 2023-03-09 NOTE — Assessment & Plan Note (Signed)
Secondary to prlonged sinus congestion .  Augmentin and prednisone given immunosuppression

## 2023-03-09 NOTE — Patient Instructions (Addendum)
You are due for your Annual Medicare Wellness visit, please schedule this appointment at checkout.    1)  For seasonal allergies, pick one of these second generation antihistamines :   generic zyrtec, which is cetirizine   Allegra , available generically as fexofenadine 180 mg once daily  claritin is also available generically as loratidine .    1B) Add flonase if symptoms are not controlled with antihistamine    For colds:  Add  afrin nasal spray twice daily for 5 days  (preferred over any oral decongestant like sudafed or sudafed PE)  Mucinex just thins out the mucus    I am prescribing Augmentin for your current infection to prevent otitis media in the right  ear and prednisone .  TAKE A PROBIOTIC FOR 3 WEEKS  KEEP THE DOXYCYCLINE FOR FUTURE USE IF YOU GET A TICK BITE THAT CAUSES REDNESS OR HEADACHE  3) osteoporosis:     Starting Evista Goal 1200 mg calcium ("algae-Cal" )

## 2023-03-09 NOTE — Assessment & Plan Note (Signed)
Reviewed recent DEXA.  Calcium and vitamin D requirements and available options for treatment.  Starting Evista

## 2023-03-09 NOTE — Assessment & Plan Note (Signed)
Etiology presumed to be orthostasis by ER physician but not clear to me given her history.  ECHO reviewed: normal.  ZIO monitor ordered

## 2023-03-10 NOTE — Transitions of Care (Post Inpatient/ED Visit) (Signed)
   03/10/2023  Name: Krista Olson MRN: [id number] DOB: 1956/11/20  Today's TOC FU Call Status: Today's TOC FU Call Status:: Successful TOC FU Call Competed Unsuccessful Call (1st Attempt) Date: 03/08/23 [facility] FU Call Complete Date: 03/10/23  Transition Care Management Follow-up Telephone Call Date of Discharge: 03/07/23 Discharge Facility: [facility]) Type of Discharge: Inpatient Admission Primary Inpatient Discharge Diagnosis:: syncope How have you been since you were released from the hospital?: Better Any questions or concerns?: No  Items Reviewed: Did you receive and understand the discharge instructions provided?: Yes Medications obtained and verified?: Yes (Medications Reviewed) Any new allergies since your discharge?: No Dietary orders reviewed?: Yes Do you have support at home?: Yes People in Home: spouse  Home Care and Equipment/Supplies: Were Home Health Services Ordered?: NA Any new equipment or medical supplies ordered?: NA  Functional Questionnaire: Do you need assistance with bathing/showering or dressing?: No Do you need assistance with meal preparation?: No Do you need assistance with eating?: No Do you have difficulty maintaining continence: No Do you need assistance with getting out of bed/getting out of a chair/moving?: No Do you have difficulty managing or taking your medications?: No  Follow up appointments reviewed: PCP Follow-up appointment confirmed?: Yes Date of PCP follow-up appointment?: 03/09/23 Follow-up Provider: [facility] Follow-up appointment confirmed?: NA Do you need transportation to your follow-up appointment?: No Do you understand care options if your condition(s) worsen?: Yes-patient verbalized understanding  Patient seen 03/09/23  SIGNATURE Karena Addison, LPN [facility] Nurse Health Advisor Direct Dial [phone]

## 2023-03-12 ENCOUNTER — Ambulatory Visit: Payer: Medicare HMO | Admitting: Internal Medicine

## 2023-03-12 DIAGNOSIS — R55 Syncope and collapse: Secondary | ICD-10-CM | POA: Diagnosis not present

## 2023-03-15 ENCOUNTER — Telehealth: Payer: Self-pay | Admitting: Internal Medicine

## 2023-03-15 NOTE — Telephone Encounter (Signed)
Contacted Krista Olson to schedule their annual wellness visit. Appointment made for 03/29/2023.  Thank you,  [facility] Support [facility] Direct dial  [phone]

## 2023-03-29 ENCOUNTER — Ambulatory Visit (INDEPENDENT_AMBULATORY_CARE_PROVIDER_SITE_OTHER): Payer: Medicare HMO

## 2023-03-29 VITALS — Wt 128.2 lb

## 2023-03-29 DIAGNOSIS — Z0000 Encounter for general adult medical examination without abnormal findings: Secondary | ICD-10-CM

## 2023-03-29 NOTE — Progress Notes (Addendum)
Subjective:   Krista Olson is a 67 y.o. female who presents for Medicare Annual (Subsequent) preventive examination.  Review of Systems    I connected with  Krista Olson on 03/29/23 by a audio enabled telemedicine application and verified that I am speaking with the correct person using two identifiers.  Patient Location: Home  Provider Location: Home Office  I discussed the limitations of evaluation and management by telemedicine. The patient expressed understanding and agreed to proceed.  Cardiac Risk Factors include: advanced age (>39men, >69 women)     Objective:    Today's Vitals   03/29/23 1146  Weight: 128 lb 3.2 oz (58.2 kg)   Body mass index is 23.45 kg/m.     03/29/2023   11:42 AM 03/07/2023    5:48 AM 03/06/2023   10:33 AM 03/09/2022    1:42 PM  Advanced Directives  Does Patient Have a Medical Advance Directive? Yes Yes Yes Yes  Type of Estate agent of [place];Living will Healthcare Power of [place];Living will  Healthcare Power of [place];Living will  Does patient want to make changes to medical advance directive?  No - Patient declined  No - Patient declined  Copy of Healthcare Power of Attorney in Chart? No - copy requested No - copy requested  No - copy requested    Current Medications (verified) Outpatient Encounter Medications as of 03/29/2023  Medication Sig   amoxicillin-clavulanate (AUGMENTIN) 875-125 MG tablet Take 1 tablet by mouth 2 (two) times daily.   atorvastatin (LIPITOR) 10 MG tablet TAKE 1 TABLET BY MOUTH AT BEDTIME   azaTHIOprine (IMURAN) 50 MG tablet Take 1 tablet (50 mg total) by mouth daily.   Azelaic Acid 15 % gel After skin is thoroughly washed and patted dry, gently but thoroughly massage a thin film of azelaic acid cream into the affected area twice daily, in the morning and evening. (Patient taking differently: Apply 1 application  topically 2 (two) times daily as needed. After skin is thoroughly washed and patted  dry, gently but thoroughly massage a thin film of azelaic acid cream into the affected area twice daily, in the morning and evening.)   Calcium Carbonate-Vitamin D (CALCIUM + D PO) Take 1 tablet by mouth daily.   doxycycline (VIBRA-TABS) 100 MG tablet Take 1 tablet (100 mg total) by mouth 2 (two) times daily.   levothyroxine (SYNTHROID) 50 MCG tablet TAKE 1 TABLET BY MOUTH ONCE DAILY ON AN EMPTY STOMACH. WAIT 30 MINUTES BEFORE TAKING OTHER MEDS.   Oxymetazoline HCl (RHOFADE) 1 % CREA Apply topically to aa of face for rosacea in the morning (Patient taking differently: Apply 1 application  topically daily as needed. Apply topically to aa of face for rosacea in the morning)   predniSONE (DELTASONE) 10 MG tablet 6 tablets on Day 1 , then reduce by 1 tablet daily until gone   raloxifene (EVISTA) 60 MG tablet Take 1 tablet (60 mg total) by mouth daily.   tretinoin (RETIN-A) 0.05 % cream Apply topically a  pea sized amount to face qhs as tolerated. (Patient taking differently: Apply 1 application  topically daily as needed. Apply topically a  pea sized amount to face qhs as tolerated.)   No facility-administered encounter medications on file as of 03/29/2023.    Allergies (verified) Patient has no known allergies.   History: Past Medical History:  Diagnosis Date   Hepatitis, autoimmune (HCC)    Thyroid disease    Past Surgical History:  Procedure Laterality Date  PERCUTANEOUS LIVER BIOPSY     Family History  Problem Relation Age of Onset   Goiter Mother    Heart disease Mother 44       died during valve replacement   Kidney disease Father    Cancer Brother 68       renal cell Ca   Heart disease Brother        CARDIAC AREEST DURING SURGERY    Cancer Maternal Aunt 108       breast cancer , 1of 7 sisters only   Breast cancer Maternal Aunt 23   Social History   Socioeconomic History   Marital status: Married    Spouse name: Not on file   Number of children: Not on file   Years of  education: Not on file   Highest education level: Master's degree (e.g., MA, MS, MEng, MEd, MSW, MBA)  Occupational History   Not on file  Tobacco Use   Smoking status: Never   Smokeless tobacco: Never  Vaping Use   Vaping Use: Not on file  Substance and Sexual Activity   Alcohol use: Yes    Alcohol/week: 5.0 standard drinks of alcohol    Types: 5 Glasses of wine per week    Comment: occ   Drug use: No   Sexual activity: Not on file  Other Topics Concern   Not on file  Social History Narrative   Not on file   Social Determinants of Health   Financial Resource Strain: Low Risk  (03/29/2023)   Overall Financial Resource Strain (CARDIA)    Difficulty of Paying Living Expenses: Not hard at all  Food Insecurity: No Food Insecurity (03/29/2023)   Hunger Vital Sign    Worried About Running Out of Food in the Last Year: Never true    Ran Out of Food in the Last Year: Never true  Transportation Needs: No Transportation Needs (03/29/2023)   PRAPARE - Administrator, Civil Service (Medical): No    Lack of Transportation (Non-Medical): No  Physical Activity: Sufficiently Active (03/29/2023)   Exercise Vital Sign    Days of Exercise per Week: 7 days    Minutes of Exercise per Session: 30 min  Recent Concern: Physical Activity - Insufficiently Active (03/08/2023)   Exercise Vital Sign    Days of Exercise per Week: 3 days    Minutes of Exercise per Session: 30 min  Stress: No Stress Concern Present (03/08/2023)   [employer] of Occupational Health - Occupational Stress Questionnaire    Feeling of Stress : Only a little  Social Connections: Socially Integrated (03/08/2023)   Social Connection and Isolation Panel [NHANES]    Frequency of Communication with Friends and Family: More than three times a week    Frequency of Social Gatherings with Friends and Family: Once a week    Attends Religious Services: 1 to 4 times per year    Active Member of [employer] or Organizations: Yes     Attends Engineer, structural: More than 4 times per year    Marital Status: Married    Tobacco Counseling Counseling given: Not Answered   Clinical Intake:  Pre-visit preparation completed: Yes  Pain : No/denies pain     BMI - recorded: 23.45 Nutritional Risks: None Diabetes: No  How often do you need to have someone help you when you read instructions, pamphlets, or other written materials from your doctor or pharmacy?: 1 - Never  Diabetic?no  Interpreter Needed?: No  Information  entered by :: Fredirick Maudlin   Activities of Daily Living    03/29/2023   11:45 AM 03/07/2023    5:48 AM  In your present state of health, do you have any difficulty performing the following activities:  Hearing? 0 0  Vision? 0 0  Difficulty concentrating or making decisions? 0 0  Walking or climbing stairs? 0 0  Dressing or bathing? 0 0  Doing errands, shopping? 0 0  Preparing Food and eating ? N   Using the Toilet? N   In the past six months, have you accidently leaked urine? N   Do you have problems with loss of bowel control? N   Managing your Medications? N   Managing your Finances? N   Housekeeping or managing your Housekeeping? N     Patient Care Team: Sherlene Shams, MD as PCP - General (Internal Medicine) Woodfin Ganja, MD as Referring Physician (Internal Medicine)  Indicate any recent Medical Services you may have received from other than [facility] providers in the past year (date may be approximate).     Assessment:   This is a routine wellness examination for Shanesha.  Hearing/Vision screen Hearing Screening - Comments:: Denies hearing difficulties   Vision Screening - Comments:: Wears rx glasses - up to date with [facility] exams with  [facility]    Dietary issues and exercise activities discussed: Current Exercise Habits: Structured exercise class, Type of exercise: yoga, Time (Minutes): 60, Frequency (Times/Week): 1, Weekly Exercise (Minutes/Week):  60   Goals Addressed             This Visit's Progress    Increase physical activity   On track    Patient Stated       Have good time keep living       Depression Screen    03/29/2023   11:40 AM 03/09/2023   10:40 AM 10/07/2022    3:08 PM 03/09/2022    1:42 PM 11/11/2021    8:52 AM 02/24/2021    4:34 PM 12/31/2020    3:23 PM  PHQ 2/9 Scores  PHQ - 2 Score 0 0 0 0 0 0 0    Fall Risk    03/29/2023   11:44 AM 03/09/2023   10:39 AM 10/07/2022    3:08 PM 03/09/2022    1:42 PM 11/11/2021    8:52 AM  Fall Risk   Falls in the past year? 1 1 0 0 0  Number falls in past yr: 0 0  0 0  Injury with Fall? 0 1   0  Risk for fall due to : Impaired balance/gait;Orthopedic patient History of fall(s) No Fall Risks  No Fall Risks  Follow up Education provided;Falls prevention discussed Falls evaluation completed Falls evaluation completed Falls evaluation completed Falls evaluation completed    FALL RISK PREVENTION PERTAINING TO THE HOME:  Any stairs in or around the home? No  If so, are there any without handrails? No  Home free of loose throw rugs in walkways, pet beds, electrical cords, etc? No  Adequate lighting in your home to reduce risk of falls? No   ASSISTIVE DEVICES UTILIZED TO PREVENT FALLS:  Life alert? No  Use of a cane, walker or w/c? No  Grab bars in the bathroom? No  Shower chair or bench in shower? Yes  Elevated toilet seat or a handicapped toilet? No   TIMED UP AND GO:  Was the test performed? No . Televisit   Cognitive Function:  03/29/2023   11:40 AM  6CIT Screen  What Year? 0 points  What month? 0 points  What time? 0 points  Count back from 20 0 points  Months in reverse 0 points  Repeat phrase 0 points  Total Score 0 points    Immunizations Immunization History  Administered Date(s) Administered   Covid-19, Mrna,Vaccine(Spikevax)50yrs and older 09/27/2022   Fluad Quad(high Dose 65+) 09/11/2021   Hepatitis A 02/02/2012, 08/04/2012    Hepatitis A, Adult 03/08/2009, 10/14/2009   Hepatitis B 02/02/1996, 06/03/1996, 10/04/1996   Influenza Split 08/23/2012   Influenza, High Dose Seasonal PF 09/27/2022   Influenza,inj,Quad PF,6+ Mos 08/03/2019, 09/10/2020   Influenza-Unspecified 09/11/2015, 09/06/2018   Moderna Sars-Covid-2 Vaccination 02/21/2020, 03/20/2020, 09/24/2020, 05/28/2021   PNEUMOCOCCAL CONJUGATE-20 12/17/2022   Pneumococcal Conjugate-13 04/06/2017   Pneumococcal Polysaccharide-23 03/12/2015   Tdap 02/01/2014   Zoster Recombinat (Shingrix) 07/05/2017, 11/30/2018, 02/22/2019    TDAP status: Up to date  Flu Vaccine status: Up to date  Pneumococcal vaccine status: Up to date  Covid-19 vaccine status: Information provided on how to obtain vaccines.   Qualifies for Shingles Vaccine? Yes   Zostavax completed Yes   Shingrix Completed?: Yes  Screening Tests Health Maintenance  Topic Date Due   COVID-19 Vaccine (6 - 2023-24 season) 11/22/2022   INFLUENZA VACCINE  06/24/2023   MAMMOGRAM  11/18/2023   Fecal DNA (Cologuard)  01/10/2024   DTaP/Tdap/Td (2 - Td or Tdap) 02/02/2024   Medicare Annual Wellness (AWV)  03/28/2024   Pneumonia Vaccine 1+ Years old  Completed   DEXA SCAN  Completed   Hepatitis C Screening  Completed   Zoster Vaccines- Shingrix  Completed   HPV VACCINES  Aged Out   COLONOSCOPY (Pts 45-31yrs Insurance coverage will need to be confirmed)  Discontinued    Health Maintenance  Health Maintenance Due  Topic Date Due   COVID-19 Vaccine (6 - 2023-24 season) 11/22/2022    Colorectal cancer screening: Type of screening: Cologuard. Completed 01/17/2021. Repeat every 5 years  Mammogram status: Completed 11/17/22. Repeat every year  Bone Density status: Completed 01/20/23. Results reflect: Bone density results: OSTEOPOROSIS. Repeat every 5-10 years.  Lung Cancer Screening: (Low Dose CT Chest recommended if Age 72-80 years, 30 pack-year currently smoking OR have quit w/in 15years.) does  not qualify.   Additional Screening:  Hepatitis C Screening: does qualify; Completed 09/17/2015  Vision Screening: Recommended annual ophthalmology exams for early detection of glaucoma and other disorders of the eye. Is the patient up to date with their annual eye exam?  Yes  Who is the provider or what is the name of the office in which the patient attends annual eye exams? [facility]  If pt is not established with a provider, would they like to be referred to a provider to establish care? No .   Dental Screening: Recommended annual dental exams for proper oral hygiene  Community Resource Referral / Chronic Care Management: CRR required this visit?  No   CCM required this visit?  No      Plan:     I have personally reviewed and noted the following in the patient's chart:   Medical and social history Use of alcohol, tobacco or illicit drugs  Current medications and supplements including opioid prescriptions. Patient is not currently taking opioid prescriptions. Functional ability and status Nutritional status Physical activity Advanced directives List of other physicians Hospitalizations, surgeries, and ER visits in previous 12 months Vitals Screenings to include cognitive, depression, and falls Referrals and  appointments  In addition, I have reviewed and discussed with patient certain preventive protocols, quality metrics, and best practice recommendations. A written personalized care plan for preventive services as well as general preventive health recommendations were provided to patient.     Annabell Sabal, CMA   03/29/2023   Nurse Notes: none     I have reviewed the above information and agree with above.   Duncan Dull, MD

## 2023-03-29 NOTE — Patient Instructions (Signed)
Krista Olson , Thank you for taking time to come for your Medicare Wellness Visit. I appreciate your ongoing commitment to your health goals. Please review the following plan we discussed and let me know if I can assist you in the future.   These are the goals we discussed:  Goals      Increase physical activity     Patient Stated     Have good time keep living         This is a list of the screening recommended for you and due dates:  Health Maintenance  Topic Date Due   COVID-19 Vaccine (6 - 2023-24 season) 11/22/2022   Flu Shot  06/24/2023   Mammogram  11/18/2023   Cologuard (Stool DNA test)  01/10/2024   DTaP/Tdap/Td vaccine (2 - Td or Tdap) 02/02/2024   Medicare Annual Wellness Visit  03/28/2024   Pneumonia Vaccine  Completed   DEXA scan (bone density measurement)  Completed   Hepatitis C Screening: USPSTF Recommendation to screen - Ages 16-79 yo.  Completed   Zoster (Shingles) Vaccine  Completed   HPV Vaccine  Aged Out   Colon Cancer Screening  Discontinued    Advanced directives: Please bring a copy of your health care power of attorney and living will to the office to be added to your chart at your convenience.   Conditions/risks identified: Aim for 30 minutes of exercise or brisk walking, 6-8 glasses of water, and 5 servings of fruits and vegetables each day.   Next appointment: Follow up in one year for your annual wellness visit    Preventive Care 65 Years and Older, Female Preventive care refers to lifestyle choices and visits with your health care provider that can promote health and wellness. What does preventive care include? A yearly physical exam. This is also called an annual well check. Dental exams once or twice a year. Routine eye exams. Ask your health care provider how often you should have your eyes checked. Personal lifestyle choices, including: Daily care of your teeth and gums. Regular physical activity. Eating a healthy diet. Avoiding tobacco  and drug use. Limiting alcohol use. Practicing safe sex. Taking low-dose aspirin every day. Taking vitamin and mineral supplements as recommended by your health care provider. What happens during an annual well check? The services and screenings done by your health care provider during your annual well check will depend on your age, overall health, lifestyle risk factors, and family history of disease. Counseling  Your health care provider may ask you questions about your: Alcohol use. Tobacco use. Drug use. Emotional well-being. Home and relationship well-being. Sexual activity. Eating habits. History of falls. Memory and ability to understand (cognition). Work and work Astronomer. Reproductive health. Screening  You may have the following tests or measurements: Height, weight, and BMI. Blood pressure. Lipid and cholesterol levels. These may be checked every 5 years, or more frequently if you are over 74 years old. Skin check. Lung cancer screening. You may have this screening every year starting at age 39 if you have a 30-pack-year history of smoking and currently smoke or have quit within the past 15 years. Fecal occult blood test (FOBT) of the stool. You may have this test every year starting at age 66. Flexible sigmoidoscopy or colonoscopy. You may have a sigmoidoscopy every 5 years or a colonoscopy every 10 years starting at age 40. Hepatitis C blood test. Hepatitis B blood test. Sexually transmitted disease (STD) testing. Diabetes screening. This is done by  checking your blood sugar (glucose) after you have not eaten for a while (fasting). You may have this done every 1-3 years. Bone density scan. This is done to screen for osteoporosis. You may have this done starting at age 70. Mammogram. This may be done every 1-2 years. Talk to your health care provider about how often you should have regular mammograms. Talk with your health care provider about your test results,  treatment options, and if necessary, the need for more tests. Vaccines  Your health care provider may recommend certain vaccines, such as: Influenza vaccine. This is recommended every year. Tetanus, diphtheria, and acellular pertussis (Tdap, Td) vaccine. You may need a Td booster every 10 years. Zoster vaccine. You may need this after age 29. Pneumococcal 13-valent conjugate (PCV13) vaccine. One dose is recommended after age 1. Pneumococcal polysaccharide (PPSV23) vaccine. One dose is recommended after age 21. Talk to your health care provider about which screenings and vaccines you need and how often you need them. This information is not intended to replace advice given to you by your health care provider. Make sure you discuss any questions you have with your health care provider. Document Released: 12/06/2015 Document Revised: 07/29/2016 Document Reviewed: 09/10/2015 Elsevier Interactive Patient Education  2017 [employer] Prevention in the Home Falls can cause injuries. They can happen to people of all ages. There are many things you can do to make your home safe and to help prevent falls. What can I do on the outside of my home? Regularly fix the edges of walkways and driveways and fix any cracks. Remove anything that might make you trip as you walk through a door, such as a raised step or threshold. Trim any bushes or trees on the path to your home. Use bright outdoor lighting. Clear any walking paths of anything that might make someone trip, such as rocks or tools. Regularly check to see if handrails are loose or broken. Make sure that both sides of any steps have handrails. Any raised decks and porches should have guardrails on the edges. Have any leaves, snow, or ice cleared regularly. Use sand or salt on walking paths during winter. Clean up any spills in your garage right away. This includes oil or grease spills. What can I do in the bathroom? Use night  lights. Install grab bars by the toilet and in the tub and shower. Do not use towel bars as grab bars. Use non-skid mats or decals in the tub or shower. If you need to sit down in the shower, use a plastic, non-slip stool. Keep the floor dry. Clean up any water that spills on the floor as soon as it happens. Remove soap buildup in the tub or shower regularly. Attach bath mats securely with double-sided non-slip rug tape. Do not have throw rugs and other things on the floor that can make you trip. What can I do in the bedroom? Use night lights. Make sure that you have a light by your bed that is easy to reach. Do not use any sheets or blankets that are too big for your bed. They should not hang down onto the floor. Have a firm chair that has side arms. You can use this for support while you get dressed. Do not have throw rugs and other things on the floor that can make you trip. What can I do in the kitchen? Clean up any spills right away. Avoid walking on wet floors. Keep items that you use a  lot in easy-to-reach places. If you need to reach something above you, use a strong step stool that has a grab bar. Keep electrical cords out of the way. Do not use floor polish or wax that makes floors slippery. If you must use wax, use non-skid floor wax. Do not have throw rugs and other things on the floor that can make you trip. What can I do with my stairs? Do not leave any items on the stairs. Make sure that there are handrails on both sides of the stairs and use them. Fix handrails that are broken or loose. Make sure that handrails are as long as the stairways. Check any carpeting to make sure that it is firmly attached to the stairs. Fix any carpet that is loose or worn. Avoid having throw rugs at the top or bottom of the stairs. If you do have throw rugs, attach them to the floor with carpet tape. Make sure that you have a light switch at the top of the stairs and the bottom of the stairs. If  you do not have them, ask someone to add them for you. What else can I do to help prevent falls? Wear shoes that: Do not have high heels. Have rubber bottoms. Are comfortable and fit you well. Are closed at the toe. Do not wear sandals. If you use a stepladder: Make sure that it is fully opened. Do not climb a closed stepladder. Make sure that both sides of the stepladder are locked into place. Ask someone to hold it for you, if possible. Clearly mark and make sure that you can see: Any grab bars or handrails. First and last steps. Where the edge of each step is. Use tools that help you move around (mobility aids) if they are needed. These include: Canes. Walkers. Scooters. Crutches. Turn on the lights when you go into a dark area. Replace any light bulbs as soon as they burn out. Set up your furniture so you have a clear path. Avoid moving your furniture around. If any of your floors are uneven, fix them. If there are any pets around you, be aware of where they are. Review your medicines with your doctor. Some medicines can make you feel dizzy. This can increase your chance of falling. Ask your doctor what other things that you can do to help prevent falls. This information is not intended to replace advice given to you by your health care provider. Make sure you discuss any questions you have with your health care provider. Document Released: 09/05/2009 Document Revised: 04/16/2016 Document Reviewed: 12/14/2014 Elsevier Interactive Patient Education  2017 [employer].

## 2023-04-05 ENCOUNTER — Ambulatory Visit: Payer: Medicare HMO | Admitting: Dermatology

## 2023-04-26 ENCOUNTER — Encounter: Payer: Self-pay | Admitting: Internal Medicine

## 2023-04-26 ENCOUNTER — Ambulatory Visit: Payer: Medicare HMO | Admitting: Dermatology

## 2023-05-01 ENCOUNTER — Encounter: Payer: Self-pay | Admitting: Internal Medicine

## 2023-05-04 MED ORDER — RALOXIFENE HCL 60 MG PO TABS: 60.0000 mg | ORAL_TABLET | Freq: Every day | ORAL | 1 refills | Status: DC

## 2023-06-14 ENCOUNTER — Other Ambulatory Visit: Payer: Self-pay | Admitting: Internal Medicine

## 2023-06-14 DIAGNOSIS — E034 Atrophy of thyroid (acquired): Secondary | ICD-10-CM

## 2023-06-17 ENCOUNTER — Other Ambulatory Visit: Payer: Self-pay | Admitting: Internal Medicine

## 2023-07-05 ENCOUNTER — Encounter: Payer: Self-pay | Admitting: Internal Medicine

## 2023-07-05 MED ORDER — RALOXIFENE HCL 60 MG PO TABS: 60.0000 mg | ORAL_TABLET | Freq: Every day | ORAL | 1 refills | Status: DC

## 2023-09-06 ENCOUNTER — Encounter: Payer: Self-pay | Admitting: Internal Medicine

## 2023-09-06 ENCOUNTER — Ambulatory Visit: Payer: Medicare HMO | Admitting: Internal Medicine

## 2023-09-06 VITALS — BP 126/86 | HR 65 | Temp 98.6°F | Ht 62.0 in | Wt 133.4 lb

## 2023-09-06 DIAGNOSIS — R5383 Other fatigue: Secondary | ICD-10-CM | POA: Diagnosis not present

## 2023-09-06 DIAGNOSIS — R7301 Impaired fasting glucose: Secondary | ICD-10-CM | POA: Diagnosis not present

## 2023-09-06 DIAGNOSIS — E034 Atrophy of thyroid (acquired): Secondary | ICD-10-CM | POA: Diagnosis not present

## 2023-09-06 DIAGNOSIS — K754 Autoimmune hepatitis: Secondary | ICD-10-CM

## 2023-09-06 DIAGNOSIS — Z23 Encounter for immunization: Secondary | ICD-10-CM | POA: Diagnosis not present

## 2023-09-06 DIAGNOSIS — R030 Elevated blood-pressure reading, without diagnosis of hypertension: Secondary | ICD-10-CM

## 2023-09-06 DIAGNOSIS — M810 Age-related osteoporosis without current pathological fracture: Secondary | ICD-10-CM

## 2023-09-06 DIAGNOSIS — E785 Hyperlipidemia, unspecified: Secondary | ICD-10-CM

## 2023-09-06 DIAGNOSIS — J00 Acute nasopharyngitis [common cold]: Secondary | ICD-10-CM

## 2023-09-06 LAB — COMPREHENSIVE METABOLIC PANEL
ALT: 16 U/L (ref 0–35)
AST: 20 U/L (ref 0–37)
Albumin: 4.3 g/dL (ref 3.5–5.2)
Alkaline Phosphatase: 46 U/L (ref 39–117)
BUN: 11 mg/dL (ref 6–23)
CO2: 26 meq/L (ref 19–32)
Calcium: 9.7 mg/dL (ref 8.4–10.5)
Chloride: 106 meq/L (ref 96–112)
Creatinine, Ser: 0.61 mg/dL (ref 0.40–1.20)
GFR: 92.22 mL/min (ref >60.00)
Glucose, Bld: 92 mg/dL (ref 70–99)
Potassium: 4.5 meq/L (ref 3.5–5.1)
Sodium: 141 meq/L (ref 135–145)
Total Bilirubin: 0.5 mg/dL (ref 0.2–1.2)
Total Protein: 7.2 g/dL (ref 6.0–8.3)

## 2023-09-06 LAB — LIPID PANEL
Cholesterol: 197 mg/dL (ref 0–200)
HDL: 59.6 mg/dL (ref >39.00)
LDL Cholesterol: 119 mg/dL — ABNORMAL HIGH (ref 0–99)
NonHDL: 137.01
Total CHOL/HDL Ratio: 3
Triglycerides: 92 mg/dL (ref 0.0–149.0)
VLDL: 18.4 mg/dL (ref 0.0–40.0)

## 2023-09-06 LAB — CBC WITH DIFFERENTIAL/PLATELET
Basophils Absolute: 0 10*3/uL (ref 0.0–0.1)
Basophils Relative: 0.3 % (ref 0.0–3.0)
Eosinophils Absolute: 0.1 10*3/uL (ref 0.0–0.7)
Eosinophils Relative: 0.9 % (ref 0.0–5.0)
HCT: 42.9 % (ref 36.0–46.0)
Hemoglobin: 13.9 g/dL (ref 12.0–15.0)
Lymphocytes Relative: 28.6 % (ref 12.0–46.0)
Lymphs Abs: 1.6 10*3/uL (ref 0.7–4.0)
MCHC: 32.5 g/dL (ref 30.0–36.0)
MCV: 96.3 fL (ref 78.0–100.0)
Monocytes Absolute: 0.6 10*3/uL (ref 0.1–1.0)
Monocytes Relative: 10.7 % (ref 3.0–12.0)
Neutro Abs: 3.4 10*3/uL (ref 1.4–7.7)
Neutrophils Relative %: 59.5 % (ref 43.0–77.0)
Platelets: 253 10*3/uL (ref 150.0–400.0)
RBC: 4.45 Mil/uL (ref 3.87–5.11)
RDW: 14.4 % (ref 11.5–15.5)
WBC: 5.7 10*3/uL (ref 4.0–10.5)

## 2023-09-06 LAB — LDL CHOLESTEROL, DIRECT: Direct LDL: 118 mg/dL

## 2023-09-06 LAB — HEMOGLOBIN A1C: Hgb A1c MFr Bld: 6 % (ref 4.6–6.5)

## 2023-09-06 LAB — TSH: TSH: 2.79 u[IU]/mL (ref 0.35–5.50)

## 2023-09-06 MED ORDER — PREDNISONE 10 MG PO TABS: ORAL_TABLET | ORAL | 2 refills | Status: DC

## 2023-09-06 MED ORDER — ATORVASTATIN CALCIUM 10 MG PO TABS: 10.0000 mg | ORAL_TABLET | Freq: Every day | ORAL | 1 refills | Status: DC

## 2023-09-06 MED ORDER — AZATHIOPRINE 50 MG PO TABS: 50.0000 mg | ORAL_TABLET | Freq: Every day | ORAL | 5 refills | Status: DC

## 2023-09-06 NOTE — Progress Notes (Signed)
Subjective:  Patient ID: Krista Olson, female    DOB: July 12, 1956  Age: 67 y.o. MRN: [id number]  CC: The primary encounter diagnosis was Hypothyroidism due to acquired atrophy of thyroid. Diagnoses of Hyperlipidemia LDL goal <160, Impaired fasting glucose, Other fatigue, Need for influenza vaccination, Autoimmune hepatitis (HCC), Osteoporosis without current pathological fracture, unspecified osteoporosis type, Elevated blood pressure reading without diagnosis of hypertension, and Acute nasopharyngitis were also pertinent to this visit.   HPI Krista Olson presents for  Chief Complaint  Patient presents with   Medical Management of Chronic Issues    6 month follow up   1) osteoprosis:  taking raloxifene since March 2024 (DEXA Feb 2024 noted forarem T score -3.1)  2) hypothryoidism  3) HLD:  tolerating atorvastatin  4) PND:  develops symptoms 2-3 times per year  despite taking a daily antihistmaine. usually resolves with prednisone taper.   5)  autoimmune hepatitis :  asymptomatic .  she is tolerating  Imuran by [facility]..  needs for quarterly labs and maintianing vaccination sttus discussed     Outpatient Medications Prior to Visit  Medication Sig Dispense Refill   Calcium Carbonate-Vitamin D (CALCIUM + D PO) Take 1 tablet by mouth daily.     levothyroxine (SYNTHROID) 50 MCG tablet TAKE 1 TABLET BY MOUTH ONCE DAILY ON AN EMPTY STOMACH. WAIT 30 MINUTES BEFORE TAKING OTHER MEDS. 90 tablet 1   Oxymetazoline HCl (RHOFADE) 1 % CREA Apply topically to aa of face for rosacea in the morning (Patient taking differently: Apply 1 application  topically daily as needed. Apply topically to aa of face for rosacea in the morning) 30 g 1   raloxifene (EVISTA) 60 MG tablet Take 1 tablet (60 mg total) by mouth daily. 90 tablet 1   tretinoin (RETIN-A) 0.05 % cream Apply topically a  pea sized amount to face qhs as tolerated. (Patient taking differently: Apply 1 application  topically daily as needed. Apply  topically a  pea sized amount to face qhs as tolerated.) 45 g 11   atorvastatin (LIPITOR) 10 MG tablet TAKE 1 TABLET BY MOUTH AT BEDTIME 90 tablet 1   azaTHIOprine (IMURAN) 50 MG tablet Take 1 tablet (50 mg total) by mouth daily. 30 tablet 5   amoxicillin-clavulanate (AUGMENTIN) 875-125 MG tablet Take 1 tablet by mouth 2 (two) times daily. 14 tablet 0   Azelaic Acid 15 % gel After skin is thoroughly washed and patted dry, gently but thoroughly massage a thin film of azelaic acid cream into the affected area twice daily, in the morning and evening. (Patient not taking: Reported on 09/06/2023) 50 g 5   doxycycline (VIBRA-TABS) 100 MG tablet Take 1 tablet (100 mg total) by mouth 2 (two) times daily. (Patient not taking: Reported on 09/06/2023) 14 tablet 0   predniSONE (DELTASONE) 10 MG tablet 6 tablets on Day 1 , then reduce by 1 tablet daily until gone (Patient not taking: Reported on 09/06/2023) 21 tablet 0   No facility-administered medications prior to visit.    Review of Systems;  Patient denies headache, fevers, malaise, unintentional weight loss, skin rash, eye pain, sinus congestion and sinus pain, sore throat, dysphagia,  hemoptysis , cough, dyspnea, wheezing, chest pain, palpitations, orthopnea, edema, abdominal pain, nausea, melena, diarrhea, constipation, flank pain, dysuria, hematuria, urinary  Frequency, nocturia, numbness, tingling, seizures,  Focal weakness, Loss of consciousness,  Tremor, insomnia, depression, anxiety, and suicidal ideation.      Objective:  BP 126/86   Pulse 65  Subjective:  Patient ID: Krista Olson, female    DOB: July 12, 1956  Age: 67 y.o. MRN: [id number]  CC: The primary encounter diagnosis was Hypothyroidism due to acquired atrophy of thyroid. Diagnoses of Hyperlipidemia LDL goal <160, Impaired fasting glucose, Other fatigue, Need for influenza vaccination, Autoimmune hepatitis (HCC), Osteoporosis without current pathological fracture, unspecified osteoporosis type, Elevated blood pressure reading without diagnosis of hypertension, and Acute nasopharyngitis were also pertinent to this visit.   HPI Krista Olson presents for  Chief Complaint  Patient presents with   Medical Management of Chronic Issues    6 month follow up   1) osteoprosis:  taking raloxifene since March 2024 (DEXA Feb 2024 noted forarem T score -3.1)  2) hypothryoidism  3) HLD:  tolerating atorvastatin  4) PND:  develops symptoms 2-3 times per year  despite taking a daily antihistmaine. usually resolves with prednisone taper.   5)  autoimmune hepatitis :  asymptomatic .  she is tolerating  Imuran by [facility]..  needs for quarterly labs and maintianing vaccination sttus discussed     Outpatient Medications Prior to Visit  Medication Sig Dispense Refill   Calcium Carbonate-Vitamin D (CALCIUM + D PO) Take 1 tablet by mouth daily.     levothyroxine (SYNTHROID) 50 MCG tablet TAKE 1 TABLET BY MOUTH ONCE DAILY ON AN EMPTY STOMACH. WAIT 30 MINUTES BEFORE TAKING OTHER MEDS. 90 tablet 1   Oxymetazoline HCl (RHOFADE) 1 % CREA Apply topically to aa of face for rosacea in the morning (Patient taking differently: Apply 1 application  topically daily as needed. Apply topically to aa of face for rosacea in the morning) 30 g 1   raloxifene (EVISTA) 60 MG tablet Take 1 tablet (60 mg total) by mouth daily. 90 tablet 1   tretinoin (RETIN-A) 0.05 % cream Apply topically a  pea sized amount to face qhs as tolerated. (Patient taking differently: Apply 1 application  topically daily as needed. Apply  topically a  pea sized amount to face qhs as tolerated.) 45 g 11   atorvastatin (LIPITOR) 10 MG tablet TAKE 1 TABLET BY MOUTH AT BEDTIME 90 tablet 1   azaTHIOprine (IMURAN) 50 MG tablet Take 1 tablet (50 mg total) by mouth daily. 30 tablet 5   amoxicillin-clavulanate (AUGMENTIN) 875-125 MG tablet Take 1 tablet by mouth 2 (two) times daily. 14 tablet 0   Azelaic Acid 15 % gel After skin is thoroughly washed and patted dry, gently but thoroughly massage a thin film of azelaic acid cream into the affected area twice daily, in the morning and evening. (Patient not taking: Reported on 09/06/2023) 50 g 5   doxycycline (VIBRA-TABS) 100 MG tablet Take 1 tablet (100 mg total) by mouth 2 (two) times daily. (Patient not taking: Reported on 09/06/2023) 14 tablet 0   predniSONE (DELTASONE) 10 MG tablet 6 tablets on Day 1 , then reduce by 1 tablet daily until gone (Patient not taking: Reported on 09/06/2023) 21 tablet 0   No facility-administered medications prior to visit.    Review of Systems;  Patient denies headache, fevers, malaise, unintentional weight loss, skin rash, eye pain, sinus congestion and sinus pain, sore throat, dysphagia,  hemoptysis , cough, dyspnea, wheezing, chest pain, palpitations, orthopnea, edema, abdominal pain, nausea, melena, diarrhea, constipation, flank pain, dysuria, hematuria, urinary  Frequency, nocturia, numbness, tingling, seizures,  Focal weakness, Loss of consciousness,  Tremor, insomnia, depression, anxiety, and suicidal ideation.      Objective:  BP 126/86   Pulse 65  Subjective:  Patient ID: Krista Olson, female    DOB: July 12, 1956  Age: 67 y.o. MRN: [id number]  CC: The primary encounter diagnosis was Hypothyroidism due to acquired atrophy of thyroid. Diagnoses of Hyperlipidemia LDL goal <160, Impaired fasting glucose, Other fatigue, Need for influenza vaccination, Autoimmune hepatitis (HCC), Osteoporosis without current pathological fracture, unspecified osteoporosis type, Elevated blood pressure reading without diagnosis of hypertension, and Acute nasopharyngitis were also pertinent to this visit.   HPI Krista Olson presents for  Chief Complaint  Patient presents with   Medical Management of Chronic Issues    6 month follow up   1) osteoprosis:  taking raloxifene since March 2024 (DEXA Feb 2024 noted forarem T score -3.1)  2) hypothryoidism  3) HLD:  tolerating atorvastatin  4) PND:  develops symptoms 2-3 times per year  despite taking a daily antihistmaine. usually resolves with prednisone taper.   5)  autoimmune hepatitis :  asymptomatic .  she is tolerating  Imuran by [facility]..  needs for quarterly labs and maintianing vaccination sttus discussed     Outpatient Medications Prior to Visit  Medication Sig Dispense Refill   Calcium Carbonate-Vitamin D (CALCIUM + D PO) Take 1 tablet by mouth daily.     levothyroxine (SYNTHROID) 50 MCG tablet TAKE 1 TABLET BY MOUTH ONCE DAILY ON AN EMPTY STOMACH. WAIT 30 MINUTES BEFORE TAKING OTHER MEDS. 90 tablet 1   Oxymetazoline HCl (RHOFADE) 1 % CREA Apply topically to aa of face for rosacea in the morning (Patient taking differently: Apply 1 application  topically daily as needed. Apply topically to aa of face for rosacea in the morning) 30 g 1   raloxifene (EVISTA) 60 MG tablet Take 1 tablet (60 mg total) by mouth daily. 90 tablet 1   tretinoin (RETIN-A) 0.05 % cream Apply topically a  pea sized amount to face qhs as tolerated. (Patient taking differently: Apply 1 application  topically daily as needed. Apply  topically a  pea sized amount to face qhs as tolerated.) 45 g 11   atorvastatin (LIPITOR) 10 MG tablet TAKE 1 TABLET BY MOUTH AT BEDTIME 90 tablet 1   azaTHIOprine (IMURAN) 50 MG tablet Take 1 tablet (50 mg total) by mouth daily. 30 tablet 5   amoxicillin-clavulanate (AUGMENTIN) 875-125 MG tablet Take 1 tablet by mouth 2 (two) times daily. 14 tablet 0   Azelaic Acid 15 % gel After skin is thoroughly washed and patted dry, gently but thoroughly massage a thin film of azelaic acid cream into the affected area twice daily, in the morning and evening. (Patient not taking: Reported on 09/06/2023) 50 g 5   doxycycline (VIBRA-TABS) 100 MG tablet Take 1 tablet (100 mg total) by mouth 2 (two) times daily. (Patient not taking: Reported on 09/06/2023) 14 tablet 0   predniSONE (DELTASONE) 10 MG tablet 6 tablets on Day 1 , then reduce by 1 tablet daily until gone (Patient not taking: Reported on 09/06/2023) 21 tablet 0   No facility-administered medications prior to visit.    Review of Systems;  Patient denies headache, fevers, malaise, unintentional weight loss, skin rash, eye pain, sinus congestion and sinus pain, sore throat, dysphagia,  hemoptysis , cough, dyspnea, wheezing, chest pain, palpitations, orthopnea, edema, abdominal pain, nausea, melena, diarrhea, constipation, flank pain, dysuria, hematuria, urinary  Frequency, nocturia, numbness, tingling, seizures,  Focal weakness, Loss of consciousness,  Tremor, insomnia, depression, anxiety, and suicidal ideation.      Objective:  BP 126/86   Pulse 65  Temp 98.6 F (37 C) (Oral)   Ht 5\' 2"  (1.575 m)   Wt 133 lb 6.4 oz (60.5 kg)   SpO2 96%   BMI 24.40 kg/m   BP Readings from Last 3 Encounters:  09/06/23 126/86  03/09/23 118/82  03/07/23 116/78    Wt Readings from Last 3 Encounters:  09/06/23 133 lb 6.4 oz (60.5 kg)  03/29/23 128 lb 3.2 oz (58.2 kg)  03/09/23 128 lb 3.2 oz (58.2 kg)    Physical Exam Vitals reviewed.  Constitutional:       General: She is not in acute distress.    Appearance: Normal appearance. She is normal weight. She is not ill-appearing, toxic-appearing or diaphoretic.  HENT:     Head: Normocephalic.  Eyes:     General: No scleral icterus.       Right eye: No discharge.        Left eye: No discharge.     Conjunctiva/sclera: Conjunctivae normal.  Cardiovascular:     Rate and Rhythm: Normal rate and regular rhythm.     Heart sounds: Normal heart sounds.  Pulmonary:     Effort: Pulmonary effort is normal. No respiratory distress.     Breath sounds: Normal breath sounds.  Musculoskeletal:        General: Normal range of motion.  Skin:    General: Skin is warm and dry.  Neurological:     General: No focal deficit present.     Mental Status: She is alert and oriented to person, place, and time. Mental status is at baseline.  Psychiatric:        Mood and Affect: Mood normal.        Behavior: Behavior normal.        Thought Content: Thought content normal.        Judgment: Judgment normal.    Lab Results  Component Value Date   HGBA1C 6.0 09/06/2023   HGBA1C 6.2 12/15/2022   HGBA1C 6.1 07/30/2021    Lab Results  Component Value Date   CREATININE 0.61 09/06/2023   CREATININE 0.59 03/07/2023   CREATININE 0.65 03/06/2023    Lab Results  Component Value Date   WBC 5.7 09/06/2023   HGB 13.9 09/06/2023   HCT 42.9 09/06/2023   PLT 253.0 09/06/2023   GLUCOSE 92 09/06/2023   CHOL 197 09/06/2023   TRIG 92.0 09/06/2023   HDL 59.60 09/06/2023   LDLDIRECT 118.0 09/06/2023   LDLCALC 119 (H) 09/06/2023   ALT 16 09/06/2023   AST 20 09/06/2023   NA 141 09/06/2023   K 4.5 09/06/2023   CL 106 09/06/2023   CREATININE 0.61 09/06/2023   BUN 11 09/06/2023   CO2 26 09/06/2023   TSH 2.79 09/06/2023   INR 1.0 08/05/2022   HGBA1C 6.0 09/06/2023    ECHOCARDIOGRAM COMPLETE  Result Date: 03/07/2023    ECHOCARDIOGRAM REPORT   Patient Name:   TEMIA DEBROUX Date of Exam: 03/07/2023 Medical Rec #:   [id number]    Height:       62.0 in Accession #:    [id number]   Weight:       129.6 lb Date of Birth:  10/30/1956    BSA:          1.590 m Patient Age:    66 years     BP:           129/91 mmHg Patient Gender: F            HR:  Temp 98.6 F (37 C) (Oral)   Ht 5\' 2"  (1.575 m)   Wt 133 lb 6.4 oz (60.5 kg)   SpO2 96%   BMI 24.40 kg/m   BP Readings from Last 3 Encounters:  09/06/23 126/86  03/09/23 118/82  03/07/23 116/78    Wt Readings from Last 3 Encounters:  09/06/23 133 lb 6.4 oz (60.5 kg)  03/29/23 128 lb 3.2 oz (58.2 kg)  03/09/23 128 lb 3.2 oz (58.2 kg)    Physical Exam Vitals reviewed.  Constitutional:       General: She is not in acute distress.    Appearance: Normal appearance. She is normal weight. She is not ill-appearing, toxic-appearing or diaphoretic.  HENT:     Head: Normocephalic.  Eyes:     General: No scleral icterus.       Right eye: No discharge.        Left eye: No discharge.     Conjunctiva/sclera: Conjunctivae normal.  Cardiovascular:     Rate and Rhythm: Normal rate and regular rhythm.     Heart sounds: Normal heart sounds.  Pulmonary:     Effort: Pulmonary effort is normal. No respiratory distress.     Breath sounds: Normal breath sounds.  Musculoskeletal:        General: Normal range of motion.  Skin:    General: Skin is warm and dry.  Neurological:     General: No focal deficit present.     Mental Status: She is alert and oriented to person, place, and time. Mental status is at baseline.  Psychiatric:        Mood and Affect: Mood normal.        Behavior: Behavior normal.        Thought Content: Thought content normal.        Judgment: Judgment normal.    Lab Results  Component Value Date   HGBA1C 6.0 09/06/2023   HGBA1C 6.2 12/15/2022   HGBA1C 6.1 07/30/2021    Lab Results  Component Value Date   CREATININE 0.61 09/06/2023   CREATININE 0.59 03/07/2023   CREATININE 0.65 03/06/2023    Lab Results  Component Value Date   WBC 5.7 09/06/2023   HGB 13.9 09/06/2023   HCT 42.9 09/06/2023   PLT 253.0 09/06/2023   GLUCOSE 92 09/06/2023   CHOL 197 09/06/2023   TRIG 92.0 09/06/2023   HDL 59.60 09/06/2023   LDLDIRECT 118.0 09/06/2023   LDLCALC 119 (H) 09/06/2023   ALT 16 09/06/2023   AST 20 09/06/2023   NA 141 09/06/2023   K 4.5 09/06/2023   CL 106 09/06/2023   CREATININE 0.61 09/06/2023   BUN 11 09/06/2023   CO2 26 09/06/2023   TSH 2.79 09/06/2023   INR 1.0 08/05/2022   HGBA1C 6.0 09/06/2023    ECHOCARDIOGRAM COMPLETE  Result Date: 03/07/2023    ECHOCARDIOGRAM REPORT   Patient Name:   TEMIA DEBROUX Date of Exam: 03/07/2023 Medical Rec #:   [id number]    Height:       62.0 in Accession #:    [id number]   Weight:       129.6 lb Date of Birth:  10/30/1956    BSA:          1.590 m Patient Age:    66 years     BP:           129/91 mmHg Patient Gender: F            HR:

## 2023-09-06 NOTE — Assessment & Plan Note (Signed)
Occurring 2-3 times a year with PND, RHINITIS. PREDNISONE TAPER FOR PRN USE REQUESTED,  She is advised to contact me if symptoms do not resolve or progress to include sinus pain,  fevers,

## 2023-09-06 NOTE — Assessment & Plan Note (Signed)
She has  no prior history of hypertension  home readings have been done and reviewed ; all have been < 130/80

## 2023-09-06 NOTE — Patient Instructions (Signed)
I WILL REFILL YOUR LEVOTHYROXINE AND ATORVASTATIN THROUGH THE MAIL ORDER PHARMACY AFTER REVIEWING TODAY'S LABS   THE LAB ORDERS FOR [facility] CAN BE TAKEN TO ANY [facility] DRAW STATION  IN LATE Krista Olson

## 2023-09-06 NOTE — Assessment & Plan Note (Addendum)
Treated with Imuran x 3 + years.  Repeat liver enzymes are normal. .  Annual Follow up with  GNP Annie Sable at [facility] has  been done in March ,. Hepatitis A&B vaccines up to date .    Lab Results  Component Value Date   ALT 27 03/06/2023   AST 34 03/06/2023   ALKPHOS 72 03/06/2023   BILITOT 0.6 03/06/2023

## 2023-09-06 NOTE — Assessment & Plan Note (Signed)
Thyroid function is WNL on 50 mcg levothyroxine   Lab Results  Component Value Date   TSH 2.79 09/06/2023

## 2023-09-06 NOTE — Assessment & Plan Note (Signed)
Forearm T score was -3.1,   she is tolerated Evista

## 2023-09-20 ENCOUNTER — Other Ambulatory Visit: Payer: Self-pay | Admitting: Internal Medicine

## 2023-09-20 DIAGNOSIS — E034 Atrophy of thyroid (acquired): Secondary | ICD-10-CM

## 2023-11-26 ENCOUNTER — Other Ambulatory Visit: Payer: Self-pay | Admitting: Internal Medicine

## 2023-12-08 ENCOUNTER — Ambulatory Visit
Admission: EM | Admit: 2023-12-08 | Discharge: 2023-12-08 | Disposition: A | Discharge status: Home / Self Care | Payer: Medicare HMO | Attending: Emergency Medicine | Admitting: Emergency Medicine

## 2023-12-08 ENCOUNTER — Encounter: Payer: Self-pay | Admitting: Emergency Medicine

## 2023-12-08 DIAGNOSIS — R300 Dysuria: Secondary | ICD-10-CM | POA: Insufficient documentation

## 2023-12-08 LAB — POCT URINALYSIS DIP (MANUAL ENTRY)
Bilirubin, UA: NEGATIVE
Glucose, UA: NEGATIVE mg/dL
Ketones, POC UA: NEGATIVE mg/dL
Nitrite, UA: NEGATIVE
Protein Ur, POC: NEGATIVE mg/dL
Spec Grav, UA: 1.01 (ref 1.010–1.025)
Urobilinogen, UA: 0.2 U/dL
pH, UA: 6 (ref 5.0–8.0)

## 2023-12-08 MED ORDER — CEPHALEXIN 500 MG PO CAPS: 500.0000 mg | ORAL_CAPSULE | Freq: Two times a day (BID) | ORAL | 0 refills | Status: AC

## 2023-12-08 NOTE — ED Provider Notes (Signed)
 Krista Olson    CSN: [id number] Arrival date & time: 12/08/23  0941      History   Chief Complaint No chief complaint on file.   HPI Krista Olson is a 68 y.o. female.  Patient presents with dysuria since yesterday.  No fever, abdominal pain, flank pain, hematuria, vaginal discharge, pelvic pain.  No OTC medications taken.  The history is provided by the patient and medical records.    Past Medical History:  Diagnosis Date   Hepatitis, autoimmune (HCC)    Thyroid  disease     Patient Active Problem List   Diagnosis Date Noted   Osteoporosis 03/09/2023   Syncope 03/06/2023   Elevated blood pressure reading without diagnosis of hypertension 10/07/2022   URI (upper respiratory infection) 11/11/2021   Bilateral hip pain 09/13/2021   Allergic conjunctivitis and rhinitis 09/10/2020   Angular cheilitis 04/04/2020   Vasomotor rhinitis 04/04/2020   Hypothyroidism 09/28/2015   Visit for preventive health examination 01/31/2013   Menopause syndrome 12/17/2011   Autoimmune hepatitis (HCC) 12/16/2011   Hyperlipidemia LDL goal <160 12/16/2011    Past Surgical History:  Procedure Laterality Date   PERCUTANEOUS LIVER BIOPSY      OB History   No obstetric history on file.      Home Medications    Prior to Admission medications   Medication Sig Start Date End Date Taking? Authorizing Provider  cephALEXin  (KEFLEX ) 500 MG capsule Take 1 capsule (500 mg total) by mouth 2 (two) times daily for 5 days. 12/08/23 12/13/23 Yes Wellington Half, NP  atorvastatin  (LIPITOR) 10 MG tablet TAKE 1 TABLET BY MOUTH AT BEDTIME 11/26/23   Thersia Flax, MD  Calcium  Carbonate-Vitamin D  (CALCIUM  + D PO) Take 1 tablet by mouth daily.    [provider]  levothyroxine  (SYNTHROID ) 50 MCG tablet TAKE 1 TABLET BY MOUTH ONCE DAILY ON AN EMPTY STOMACH. WAIT 30 MINUTES BEFORE TAKING OTHER MEDS. 09/20/23   Thersia Flax, MD  Oxymetazoline  HCl (RHOFADE ) 1 % CREA Apply topically to aa of  face for rosacea in the morning Patient taking differently: Apply 1 application  topically daily as needed. Apply topically to aa of face for rosacea in the morning 12/22/22   Artemio Larry, MD  predniSONE  (DELTASONE ) 10 MG tablet 6 tablets on Day 1 , then reduce by 1 tablet daily until gone 09/06/23   Tullo, Teresa L, MD  raloxifene  (EVISTA ) 60 MG tablet Take 1 tablet (60 mg total) by mouth daily. 07/05/23   Thersia Flax, MD  tretinoin  (RETIN-A ) 0.05 % cream Apply topically a  pea sized amount to face qhs as tolerated. Patient taking differently: Apply 1 application  topically daily as needed. Apply topically a  pea sized amount to face qhs as tolerated. 12/22/22   Artemio Larry, MD    Family History Family History  Problem Relation Age of Onset   Goiter Mother    Heart disease Mother 79       died during valve replacement   Kidney disease Father    Cancer Brother 68       renal cell Ca   Heart disease Brother        CARDIAC AREEST DURING SURGERY    Cancer Maternal Aunt 3       breast cancer , 1of 7 sisters only   Breast cancer Maternal Aunt 81    Social History Social History   Tobacco Use   Smoking status: Never   Smokeless tobacco:  Never  Substance Use Topics   Alcohol use: Yes    Alcohol/week: 5.0 standard drinks of alcohol    Types: 5 Glasses of wine per week    Comment: occ   Drug use: No     Allergies   Patient has no known allergies.   Review of Systems Review of Systems  Constitutional:  Negative for chills and fever.  Gastrointestinal:  Negative for abdominal pain.  Genitourinary:  Positive for dysuria. Negative for flank pain, hematuria, pelvic pain and vaginal discharge.     Physical Exam Triage Vital Signs ED Triage Vitals  Encounter Vitals Group     BP 12/08/23 1026 137/89     Systolic BP Percentile --      Diastolic BP Percentile --      Pulse Rate 12/08/23 1016 74     Resp 12/08/23 1016 18     Temp 12/08/23 1016 98 F (36.7 C)     Temp  src --      SpO2 12/08/23 1016 95 %     Weight --      Height --      Head Circumference --      Peak Flow --      Pain Score --      Pain Loc --      Pain Education --      Exclude from Growth Chart --    No data found.  Updated Vital Signs BP 137/89   Pulse 74   Temp 98 F (36.7 C)   Resp 18   SpO2 95%   Visual Acuity Right Eye Distance:   Left Eye Distance:   Bilateral Distance:    Right Eye Near:   Left Eye Near:    Bilateral Near:     Physical Exam Constitutional:      General: She is not in acute distress. HENT:     Mouth/Throat:     Mouth: Mucous membranes are moist.  Cardiovascular:     Rate and Rhythm: Normal rate and regular rhythm.  Pulmonary:     Effort: Pulmonary effort is normal. No respiratory distress.  Abdominal:     General: Bowel sounds are normal.     Palpations: Abdomen is soft.     Tenderness: There is no abdominal tenderness. There is no right CVA tenderness, left CVA tenderness, guarding or rebound.  Neurological:     Mental Status: She is alert.      UC Treatments / Results  Labs (all labs ordered are listed, but only abnormal results are displayed) Labs Reviewed  POCT URINALYSIS DIP (MANUAL ENTRY) - Abnormal; Notable for the following components:      Result Value   Clarity, UA cloudy (*)    Blood, UA large (*)    Leukocytes, UA Moderate (2+) (*)    All other components within normal limits  URINE CULTURE    EKG   Radiology No results found.  Procedures Procedures (including critical care time)  Medications Ordered in UC Medications - No data to display  Initial Impression / Assessment and Plan / UC Course  I have reviewed the triage vital signs and the nursing notes.  Pertinent labs & imaging results that were available during my care of the patient were reviewed by me and considered in my medical decision making (see chart for details).    Dysuria.  Treating with Keflex . Urine culture pending. Discussed with  patient that we will call her if the urine culture shows  the need to change or discontinue the antibiotic. Instructed her to follow-up with her PCP if her symptoms are not improving. Patient agrees to plan of care.     Final Clinical Impressions(s) / UC Diagnoses   Final diagnoses:  Dysuria     Discharge Instructions      Take the antibiotic as directed.  The urine culture is pending.  We will call you if it shows the need to change or discontinue your antibiotic.    Follow-up with your primary care provider if your symptoms are not improving.      ED Prescriptions     Medication Sig Dispense Auth. Provider   cephALEXin  (KEFLEX ) 500 MG capsule Take 1 capsule (500 mg total) by mouth 2 (two) times daily for 5 days. 10 capsule Wellington Half, NP      PDMP not reviewed this encounter.   Wellington Half, NP 12/08/23 [phone]

## 2023-12-08 NOTE — Discharge Instructions (Addendum)
 Take the antibiotic as directed.  The urine culture is pending.  We will call you if it shows the need to change or discontinue your antibiotic.    Follow up with your primary care provider if your symptoms are not improving.

## 2023-12-09 LAB — URINE CULTURE: Culture: <10000 — AB

## 2023-12-28 ENCOUNTER — Encounter: Payer: Medicare HMO | Admitting: Dermatology

## 2024-01-05 ENCOUNTER — Other Ambulatory Visit: Payer: Self-pay | Admitting: Internal Medicine

## 2024-01-05 DIAGNOSIS — Z1231 Encounter for screening mammogram for malignant neoplasm of breast: Secondary | ICD-10-CM

## 2024-01-18 ENCOUNTER — Ambulatory Visit
Admission: RE | Admit: 2024-01-18 | Discharge: 2024-01-18 | Disposition: A | Discharge status: Home / Self Care | Payer: Medicare HMO | Source: Ambulatory Visit | Attending: Internal Medicine | Admitting: Internal Medicine

## 2024-01-18 DIAGNOSIS — Z1231 Encounter for screening mammogram for malignant neoplasm of breast: Secondary | ICD-10-CM

## 2024-03-15 ENCOUNTER — Other Ambulatory Visit: Payer: Self-pay | Admitting: Internal Medicine

## 2024-03-15 DIAGNOSIS — E034 Atrophy of thyroid (acquired): Secondary | ICD-10-CM

## 2024-03-20 ENCOUNTER — Telehealth: Payer: Self-pay

## 2024-03-20 DIAGNOSIS — E785 Hyperlipidemia, unspecified: Secondary | ICD-10-CM

## 2024-03-20 DIAGNOSIS — E034 Atrophy of thyroid (acquired): Secondary | ICD-10-CM

## 2024-03-20 DIAGNOSIS — R7301 Impaired fasting glucose: Secondary | ICD-10-CM

## 2024-03-20 NOTE — Telephone Encounter (Signed)
 Copied from CRM [id number]. Topic: Clinical - Request for Lab/Test Order >> Mar 20, 2024  2:18 PM Caliyah H wrote: Reason for CRM: Patient called inquiring about having labs drawn. After reviewing the chart, there are no current lab orders placed by the PCP. I informed the patient that in order to have labs drawn, her PCP must submit a routine lab order. The last labs on file were drawn by Dr. Madelon Scheuermann on 09/06/2023.  Callback Number: [phone]

## 2024-03-20 NOTE — Addendum Note (Signed)
 Addended by: Thersia Flax on: 03/20/2024 05:21 PM   Modules accepted: Orders

## 2024-03-20 NOTE — Telephone Encounter (Signed)
 I have pended labs for your approval if needed.

## 2024-03-20 NOTE — Addendum Note (Signed)
 Addended by: Graceann Boileau on: 03/20/2024 03:43 PM   Modules accepted: Orders

## 2024-03-30 ENCOUNTER — Encounter: Payer: Self-pay | Admitting: Internal Medicine

## 2024-03-31 ENCOUNTER — Ambulatory Visit (INDEPENDENT_AMBULATORY_CARE_PROVIDER_SITE_OTHER): Admitting: *Deleted

## 2024-03-31 VITALS — Ht 62.0 in | Wt 133.0 lb

## 2024-03-31 DIAGNOSIS — Z0000 Encounter for general adult medical examination without abnormal findings: Secondary | ICD-10-CM

## 2024-03-31 NOTE — Patient Instructions (Signed)
 Krista Olson , Thank you for taking time out of your busy schedule to complete your Annual Wellness Visit with me. I enjoyed our conversation and look forward to speaking with you again next year. I, as well as your care team,  appreciate your ongoing commitment to your health goals. Please review the following plan we discussed and let me know if I can assist you in the future. Your Game plan/ To Do List    Referrals: If you haven't heard from the office you've been referred to, please reach out to them at the phone provided.  Remember to update your tetanus vaccine. Follow up Visits: Next Medicare AWV with our clinical staff: 04/04/25 @ 1:00   Have you seen your provider in the last 6 months (3 months if uncontrolled diabetes)? No Next Office Visit with your provider: Patient is waiting to schedule after a response to the message sent to her PCP.  Clinician Recommendations:  Aim for 30 minutes of exercise or brisk walking, 6-8 glasses of water, and 5 servings of fruits and vegetables each day.       This is a list of the screening recommended for you and due dates:  Health Maintenance  Topic Date Due   COVID-19 Vaccine (6 - 2024-25 season) 07/25/2023   Cologuard (Stool DNA test)  01/10/2024   DTaP/Tdap/Td vaccine (2 - Td or Tdap) 02/02/2024   Flu Shot  06/23/2024   Mammogram  01/17/2025   Medicare Annual Wellness Visit  03/31/2025   Pneumonia Vaccine  Completed   DEXA scan (bone density measurement)  Completed   Hepatitis C Screening  Completed   Zoster (Shingles) Vaccine  Completed   HPV Vaccine  Aged Out   Meningitis B Vaccine  Aged Out   Colon Cancer Screening  Discontinued    Advanced directives: (Copy Requested) Please bring a copy of your health care power of attorney and living will to the office to be added to your chart at your convenience. You can mail to [facility] [address] [place], [place] [address] or email to [email or web address] Advance Care  Planning is important because it:  [x]  Makes sure you receive the medical care that is consistent with your values, goals, and preferences  [x]  It provides guidance to your family and loved ones and reduces their decisional burden about whether or not they are making the right decisions based on your wishes.  Follow the link provided in your after visit summary or read over the paperwork we have mailed to you to help you started getting your Advance Directives in place. If you need assistance in completing these, please reach out to us  so that we can help you!

## 2024-03-31 NOTE — Progress Notes (Signed)
 Subjective:   Krista Olson is a 68 y.o. who presents for a Medicare Wellness preventive visit.  As a reminder, Annual Wellness Visits don't include a physical exam, and some assessments may be limited, especially if this visit is performed virtually. We may recommend an in-person visit if needed.  Visit Complete: Virtual I connected with  Krista Olson on 03/31/24 by a audio enabled telemedicine application and verified that I am speaking with the correct person using two identifiers.  Patient Location: Home  Provider Location: Office/Clinic  I discussed the limitations of evaluation and management by telemedicine. The patient expressed understanding and agreed to proceed.  Vital Signs: Because this visit was a virtual/telehealth visit, some criteria may be missing or patient reported. Any vitals not documented were not able to be obtained and vitals that have been documented are patient reported.  VideoDeclined- This patient declined Librarian, academic. Therefore the visit was completed with audio only.  Persons Participating in Visit: Patient.  AWV Questionnaire: Yes: Patient Medicare AWV questionnaire was completed by the patient on 03/30/24; I have confirmed that all information answered by patient is correct and no changes since this date.  Cardiac Risk Factors include: advanced age (>24men, >57 women);dyslipidemia     Objective:     Today's Vitals   03/31/24 1016  Weight: 133 lb (60.3 kg)  Height: 5\' 2"  (1.575 m)   Body mass index is 24.33 kg/m.     03/31/2024   10:28 AM 03/29/2023   11:42 AM 03/07/2023    5:48 AM 03/06/2023   10:33 AM 03/09/2022    1:42 PM  Advanced Directives  Does Patient Have a Medical Advance Directive? Yes Yes Yes Yes Yes  Type of Estate agent of [place];Living will Healthcare Power of [place];Living will Healthcare Power of [place];Living will  Healthcare Power of [place];Living will  Does  patient want to make changes to medical advance directive?   No - Patient declined  No - Patient declined  Copy of Healthcare Power of Attorney in Chart? No - copy requested No - copy requested No - copy requested  No - copy requested    Current Medications (verified) Outpatient Encounter Medications as of 03/31/2024  Medication Sig   atorvastatin  (LIPITOR) 10 MG tablet TAKE 1 TABLET BY MOUTH AT BEDTIME   azaTHIOprine  (IMURAN ) 50 MG tablet Take 50 mg by mouth daily.   levothyroxine  (SYNTHROID ) 50 MCG tablet TAKE 1 TABLET BY MOUTH ONCE DAILY ON AN EMPTY STOMACH. WAIT 30 MINUTES BEFORE TAKING OTHER MEDS.   Oxymetazoline  HCl (RHOFADE ) 1 % CREA Apply topically to aa of face for rosacea in the morning (Patient taking differently: Apply 1 application  topically daily as needed. Apply topically to aa of face for rosacea in the morning)   raloxifene  (EVISTA ) 60 MG tablet Take 1 tablet (60 mg total) by mouth daily.   tretinoin  (RETIN-A ) 0.05 % cream Apply topically a  pea sized amount to face qhs as tolerated. (Patient taking differently: Apply 1 application  topically daily as needed. Apply topically a  pea sized amount to face qhs as tolerated.)   [DISCONTINUED] Calcium  Carbonate-Vitamin D  (CALCIUM  + D PO) Take 1 tablet by mouth daily.   [DISCONTINUED] predniSONE  (DELTASONE ) 10 MG tablet 6 tablets on Day 1 , then reduce by 1 tablet daily until gone   No facility-administered encounter medications on file as of 03/31/2024.    Allergies (verified) Patient has no known allergies.   History: Past  Medical History:  Diagnosis Date   Hepatitis, autoimmune (HCC)    Thyroid  disease    Past Surgical History:  Procedure Laterality Date   PERCUTANEOUS LIVER BIOPSY     Family History  Problem Relation Age of Onset   Goiter Mother    Heart disease Mother 60       died during valve replacement   Kidney disease Father    Cancer Brother 59       renal cell Ca   Heart disease Brother        CARDIAC  AREEST DURING SURGERY    Cancer Maternal Aunt 46       breast cancer , 1of 7 sisters only   Breast cancer Maternal Aunt 31   Social History   Socioeconomic History   Marital status: Married    Spouse name: Not on file   Number of children: Not on file   Years of education: Not on file   Highest education level: Master's degree (e.g., MA, MS, MEng, MEd, MSW, MBA)  Occupational History   Not on file  Tobacco Use   Smoking status: Never   Smokeless tobacco: Never  Vaping Use   Vaping status: Not on file  Substance and Sexual Activity   Alcohol use: Yes    Alcohol/week: 5.0 standard drinks of alcohol    Types: 5 Glasses of wine per week    Comment: occ   Drug use: No   Sexual activity: Not on file  Other Topics Concern   Not on file  Social History Narrative   Married   Social Drivers of Health   Financial Resource Strain: Low Risk  (03/30/2024)   Overall Financial Resource Strain (CARDIA)    Difficulty of Paying Living Expenses: Not very hard  Food Insecurity: No Food Insecurity (03/30/2024)   Hunger Vital Sign    Worried About Running Out of Food in the Last Year: Never true    Ran Out of Food in the Last Year: Never true  Transportation Needs: No Transportation Needs (03/30/2024)   PRAPARE - Administrator, Civil Service (Medical): No    Lack of Transportation (Non-Medical): No  Physical Activity: Insufficiently Active (03/30/2024)   Exercise Vital Sign    Days of Exercise per Week: 2 days    Minutes of Exercise per Session: 30 min  Stress: No Stress Concern Present (03/30/2024)   [employer] of Occupational Health - Occupational Stress Questionnaire    Feeling of Stress : Not at all  Social Connections: Socially Integrated (03/30/2024)   Social Connection and Isolation Panel [NHANES]    Frequency of Communication with Friends and Family: Three times a week    Frequency of Social Gatherings with Friends and Family: Twice a week    Attends Religious  Services: 1 to 4 times per year    Active Member of [employer] or Organizations: Yes    Attends Engineer, structural: More than 4 times per year    Marital Status: Married    Tobacco Counseling Counseling given: Not Answered    Clinical Intake:  Pre-visit preparation completed: Yes  Pain : No/denies pain     BMI - recorded: 24.33 Nutritional Status: BMI of 19-24  Normal Nutritional Risks: None Diabetes: No  Lab Results  Component Value Date   HGBA1C 6.0 09/06/2023   HGBA1C 6.2 12/15/2022   HGBA1C 6.1 07/30/2021     How often do you need to have someone help you when you read instructions,  pamphlets, or other written materials from your doctor or pharmacy?: 1 - Never  Interpreter Needed?: No  Information entered by :: R. Nivaan Dicenzo LPN   Activities of Daily Living     03/31/2024   10:17 AM  In your present state of health, do you have any difficulty performing the following activities:  Hearing? 0  Vision? 0  Comment glasses  Difficulty concentrating or making decisions? 0  Walking or climbing stairs? 0  Dressing or bathing? 0  Doing errands, shopping? 0  Preparing Food and eating ? N  Using the Toilet? N  In the past six months, have you accidently leaked urine? N  Do you have problems with loss of bowel control? N  Managing your Medications? N  Managing your Finances? N  Housekeeping or managing your Housekeeping? N    Patient Care Team: Thersia Flax, MD as PCP - General (Internal Medicine) Nelda Balsam, MD as Referring Physician (Internal Medicine)  Indicate any recent Medical Services you may have received from other than [facility] providers in the past year (date may be approximate).     Assessment:    This is a routine wellness examination for Krista Olson.  Hearing/Vision screen Hearing Screening - Comments:: No issues Vision Screening - Comments:: glasses   Goals Addressed             This Visit's Progress    Patient Stated       Wants to  get more aerobic exercise        Depression Screen     03/31/2024   10:24 AM 09/06/2023   11:40 AM 03/29/2023   11:40 AM 03/09/2023   10:40 AM 10/07/2022    3:08 PM 03/09/2022    1:42 PM 11/11/2021    8:52 AM  PHQ 2/9 Scores  PHQ - 2 Score 0 0 0 0 0 0 0  PHQ- 9 Score 0          Fall Risk     03/31/2024   10:18 AM 09/06/2023   11:40 AM 03/29/2023   11:44 AM 03/09/2023   10:39 AM 10/07/2022    3:08 PM  Fall Risk   Falls in the past year? 0 0 1 1 0  Number falls in past yr: 0 0 0 0   Injury with Fall? 0 0 0 1   Risk for fall due to : No Fall Risks No Fall Risks Impaired balance/gait;Orthopedic patient History of fall(s) No Fall Risks  Follow up Falls prevention discussed;Falls evaluation completed Falls evaluation completed Education provided;Falls prevention discussed Falls evaluation completed Falls evaluation completed    MEDICARE RISK AT HOME:  Medicare Risk at Home Any stairs in or around the home?: Yes If so, are there any without handrails?: No Home free of loose throw rugs in walkways, pet beds, electrical cords, etc?: Yes Adequate lighting in your home to reduce risk of falls?: Yes Life alert?: No Use of a cane, walker or w/c?: No Grab bars in the bathroom?: No Shower chair or bench in shower?: No Elevated toilet seat or a handicapped toilet?: Yes  TIMED UP AND GO:  Was the test performed?  No  Cognitive Function: 6CIT completed        03/31/2024   10:29 AM 03/29/2023   11:40 AM  6CIT Screen  What Year? 0 points 0 points  What month? 0 points 0 points  What time? 0 points 0 points  Count back from 20 0 points 0 points  Months in  reverse  0 points  Repeat phrase 0 points 0 points  Total Score  0 points    Immunizations Immunization History  Administered Date(s) Administered   Fluad Quad(high Dose 65+) 09/11/2021   Fluad Trivalent(High Dose 65+) 09/06/2023   Hepatitis A 02/02/2012, 08/04/2012   Hepatitis A, Adult 03/08/2009, 10/14/2009   Hepatitis B  02/02/1996, 06/03/1996, 10/04/1996   Influenza Split 08/23/2012   Influenza, High Dose Seasonal PF 09/27/2022   Influenza,inj,Quad PF,6+ Mos 08/03/2019, 09/10/2020   Influenza-Unspecified 09/11/2015, 09/06/2018   Moderna Covid-19 Fall Seasonal Vaccine 92yrs & older 09/27/2022   Moderna Sars-Covid-2 Vaccination 02/21/2020, 03/20/2020, 09/24/2020, 05/28/2021   PNEUMOCOCCAL CONJUGATE-20 12/17/2022   Pneumococcal Conjugate-13 04/06/2017   Pneumococcal Polysaccharide-23 03/12/2015   Tdap 02/01/2014   Zoster Recombinant(Shingrix ) 07/05/2017, 11/30/2018, 02/22/2019    Screening Tests Health Maintenance  Topic Date Due   COVID-19 Vaccine (6 - 2024-25 season) 07/25/2023   Fecal DNA (Cologuard)  01/10/2024   DTaP/Tdap/Td (2 - Td or Tdap) 02/02/2024   Medicare Annual Wellness (AWV)  03/28/2024   INFLUENZA VACCINE  06/23/2024   MAMMOGRAM  01/17/2025   Pneumonia Vaccine 4+ Years old  Completed   DEXA SCAN  Completed   Hepatitis C Screening  Completed   Zoster Vaccines- Shingrix   Completed   HPV VACCINES  Aged Out   Meningococcal B Vaccine  Aged Out   Colonoscopy  Discontinued    Health Maintenance  Health Maintenance Due  Topic Date Due   COVID-19 Vaccine (6 - 2024-25 season) 07/25/2023   Fecal DNA (Cologuard)  01/10/2024   DTaP/Tdap/Td (2 - Td or Tdap) 02/02/2024   Medicare Annual Wellness (AWV)  03/28/2024   Health Maintenance Items Addressed: Discussed the need to update tetanus vaccine. Patient declines covid vaccine. Message will be sent to PCP regarding cologuard because patient wants to discuss possibly having a colonoscopy if needed.   Additional Screening:  Vision Screening: Recommended annual ophthalmology exams for early detection of glaucoma and other disorders of the eye.Up to date [facility]  Dental Screening: Recommended annual dental exams for proper oral hygiene  Community Resource Referral / Chronic Care Management: CRR required this visit?  No   CCM  required this visit?  No   Plan:    I have personally reviewed and noted the following in the patient's chart:   Medical and social history Use of alcohol, tobacco or illicit drugs  Current medications and supplements including opioid prescriptions. Patient is not currently taking opioid prescriptions. Functional ability and status Nutritional status Physical activity Advanced directives List of other physicians Hospitalizations, surgeries, and ER visits in previous 12 months Vitals Screenings to include cognitive, depression, and falls Referrals and appointments  In addition, I have reviewed and discussed with patient certain preventive protocols, quality metrics, and best practice recommendations. A written personalized care plan for preventive services as well as general preventive health recommendations were provided to patient.   Felicitas Horse, LPN   11/28/1094   After Visit Summary: (MyChart) Due to this being a telephonic visit, the after visit summary with patients personalized plan was offered to patient via MyChart   Notes: Please refer to Routing Comments.

## 2024-04-01 ENCOUNTER — Other Ambulatory Visit: Payer: Self-pay | Admitting: Internal Medicine

## 2024-04-01 DIAGNOSIS — R030 Elevated blood-pressure reading, without diagnosis of hypertension: Secondary | ICD-10-CM

## 2024-04-01 DIAGNOSIS — K754 Autoimmune hepatitis: Secondary | ICD-10-CM

## 2024-04-01 DIAGNOSIS — Z1211 Encounter for screening for malignant neoplasm of colon: Secondary | ICD-10-CM

## 2024-04-01 DIAGNOSIS — E039 Hypothyroidism, unspecified: Secondary | ICD-10-CM

## 2024-04-01 DIAGNOSIS — E785 Hyperlipidemia, unspecified: Secondary | ICD-10-CM

## 2024-04-04 ENCOUNTER — Encounter: Payer: Self-pay | Admitting: Internal Medicine

## 2024-04-05 MED ORDER — PREDNISONE 10 MG PO TABS: ORAL_TABLET | ORAL | 0 refills | Status: DC

## 2024-04-05 NOTE — Telephone Encounter (Signed)
 Pt would like to know if you would be willing to send in prednisone  taper just incase she needs it while out of town.

## 2024-04-06 ENCOUNTER — Other Ambulatory Visit

## 2024-04-06 DIAGNOSIS — R7301 Impaired fasting glucose: Secondary | ICD-10-CM | POA: Diagnosis not present

## 2024-04-06 DIAGNOSIS — K754 Autoimmune hepatitis: Secondary | ICD-10-CM | POA: Diagnosis not present

## 2024-04-06 DIAGNOSIS — E785 Hyperlipidemia, unspecified: Secondary | ICD-10-CM | POA: Diagnosis not present

## 2024-04-06 DIAGNOSIS — E039 Hypothyroidism, unspecified: Secondary | ICD-10-CM | POA: Diagnosis not present

## 2024-04-06 LAB — COMPREHENSIVE METABOLIC PANEL WITH GFR
ALT: 20 U/L (ref 0–35)
AST: 25 U/L (ref 0–37)
Albumin: 4.4 g/dL (ref 3.5–5.2)
Alkaline Phosphatase: 68 U/L (ref 39–117)
BUN: 16 mg/dL (ref 6–23)
CO2: 27 meq/L (ref 19–32)
Calcium: 9.5 mg/dL (ref 8.4–10.5)
Chloride: 105 meq/L (ref 96–112)
Creatinine, Ser: 0.66 mg/dL (ref 0.40–1.20)
GFR: 90.12 mL/min (ref >60.00)
Glucose, Bld: 117 mg/dL — ABNORMAL HIGH (ref 70–99)
Potassium: 4.5 meq/L (ref 3.5–5.1)
Sodium: 139 meq/L (ref 135–145)
Total Bilirubin: 0.5 mg/dL (ref 0.2–1.2)
Total Protein: 7.7 g/dL (ref 6.0–8.3)

## 2024-04-06 LAB — CBC WITH DIFFERENTIAL/PLATELET
Basophils Absolute: 0.1 10*3/uL (ref 0.0–0.1)
Basophils Relative: 0.8 % (ref 0.0–3.0)
Eosinophils Absolute: 0 10*3/uL (ref 0.0–0.7)
Eosinophils Relative: 0.2 % (ref 0.0–5.0)
HCT: 43.1 % (ref 36.0–46.0)
Hemoglobin: 14.2 g/dL (ref 12.0–15.0)
Lymphocytes Relative: 7.7 % — ABNORMAL LOW (ref 12.0–46.0)
Lymphs Abs: 0.7 10*3/uL (ref 0.7–4.0)
MCHC: 32.9 g/dL (ref 30.0–36.0)
MCV: 95.3 fl (ref 78.0–100.0)
Monocytes Absolute: 0.2 10*3/uL (ref 0.1–1.0)
Monocytes Relative: 2.7 % — ABNORMAL LOW (ref 3.0–12.0)
Neutro Abs: 7.6 10*3/uL (ref 1.4–7.7)
Neutrophils Relative %: 88.6 % — ABNORMAL HIGH (ref 43.0–77.0)
Platelets: 240 10*3/uL (ref 150.0–400.0)
RBC: 4.52 Mil/uL (ref 3.87–5.11)
RDW: 13.8 % (ref 11.5–15.5)
WBC: 8.5 10*3/uL (ref 4.0–10.5)

## 2024-04-06 LAB — LIPID PANEL
Cholesterol: 184 mg/dL (ref 0–200)
HDL: 58.6 mg/dL (ref >39.00)
LDL Cholesterol: 108 mg/dL — ABNORMAL HIGH (ref 0–99)
NonHDL: 125.24
Total CHOL/HDL Ratio: 3
Triglycerides: 84 mg/dL (ref 0.0–149.0)
VLDL: 16.8 mg/dL (ref 0.0–40.0)

## 2024-04-06 LAB — HEMOGLOBIN A1C: Hgb A1c MFr Bld: 6 % (ref 4.6–6.5)

## 2024-04-06 LAB — TSH: TSH: 2.14 u[IU]/mL (ref 0.35–5.50)

## 2024-04-06 LAB — LDL CHOLESTEROL, DIRECT: Direct LDL: 114 mg/dL

## 2024-04-08 ENCOUNTER — Ambulatory Visit: Payer: Self-pay | Admitting: Internal Medicine

## 2024-04-25 LAB — COLOGUARD: COLOGUARD: NEGATIVE

## 2024-04-28 ENCOUNTER — Encounter: Payer: Self-pay | Admitting: Internal Medicine

## 2024-05-01 MED ORDER — RALOXIFENE HCL 60 MG PO TABS: 60.0000 mg | ORAL_TABLET | Freq: Every day | ORAL | 1 refills | Status: DC

## 2024-05-20 ENCOUNTER — Other Ambulatory Visit: Payer: Self-pay | Admitting: Internal Medicine

## 2024-07-23 ENCOUNTER — Encounter: Payer: Self-pay | Admitting: Internal Medicine

## 2024-07-25 MED ORDER — PREDNISONE 10 MG PO TABS: ORAL_TABLET | ORAL | 0 refills | Status: DC

## 2024-07-25 MED ORDER — AMOXICILLIN-POT CLAVULANATE 875-125 MG PO TABS: 1.0000 | ORAL_TABLET | Freq: Two times a day (BID) | ORAL | 0 refills | Status: DC

## 2024-08-14 ENCOUNTER — Encounter: Payer: Medicare HMO | Admitting: Dermatology

## 2024-09-16 ENCOUNTER — Other Ambulatory Visit: Payer: Self-pay | Admitting: Internal Medicine

## 2024-09-16 DIAGNOSIS — E034 Atrophy of thyroid (acquired): Secondary | ICD-10-CM

## 2024-11-01 ENCOUNTER — Other Ambulatory Visit: Payer: Self-pay | Admitting: Internal Medicine

## 2024-11-07 ENCOUNTER — Encounter: Payer: Self-pay | Admitting: Internal Medicine

## 2024-11-07 MED ORDER — RALOXIFENE HCL 60 MG PO TABS: 60.0000 mg | ORAL_TABLET | Freq: Every day | ORAL | 0 refills | Status: AC

## 2024-11-21 ENCOUNTER — Other Ambulatory Visit: Payer: Self-pay | Admitting: Internal Medicine

## 2024-11-28 ENCOUNTER — Other Ambulatory Visit: Payer: Self-pay | Admitting: Internal Medicine

## 2024-11-28 DIAGNOSIS — E034 Atrophy of thyroid (acquired): Secondary | ICD-10-CM

## 2024-12-21 ENCOUNTER — Encounter: Payer: Self-pay | Admitting: Internal Medicine

## 2024-12-22 ENCOUNTER — Ambulatory Visit: Payer: Self-pay | Admitting: Internal Medicine

## 2024-12-22 ENCOUNTER — Other Ambulatory Visit: Payer: Self-pay | Admitting: Internal Medicine

## 2024-12-22 MED ORDER — FLUTICASONE PROPIONATE 50 MCG/ACT NA SUSP: 2.0000 | Freq: Every day | NASAL | 6 refills | Status: AC

## 2024-12-22 MED ORDER — AZELASTINE HCL 0.1 % NA SOLN: 1.0000 | Freq: Two times a day (BID) | NASAL | 12 refills | Status: AC

## 2024-12-22 NOTE — Telephone Encounter (Signed)
 LMTCB. Please let pt know that she will need to be seen before any medications can be prescribed.

## 2024-12-22 NOTE — Telephone Encounter (Signed)
 This RN made first attempt to triage patient. No answer, LVM. Routing for additional attempts.   Reason for Triage: sometimes headache, sinus issues, no fever, some sneezing/coughing. Very congested. Would like sometime called in   [facility] - [place], [place] - [address]  [address] [place] [address]  Phone: [phone] Fax: [phone]

## 2024-12-22 NOTE — Telephone Encounter (Signed)
 SHE CAN USE AZELASTINE  AND FLONASE  NASAL SPRAYS.  BOTH HAVE BEEN SENT TO PHARMACY.  THEY WORK DIFFERENTLY AND CAN BE USED TOGETHER FOR SYMPTOMS DEXCRIBED

## 2024-12-22 NOTE — Telephone Encounter (Signed)
 Pt returned call and she was advised that she would need to be evaluated before any medication could be prescribed. Pt stated that she would hold off and see how she feels at the beginning of the week. I advised her that due to the weather this weekend we were not sure what our schedule was going to look like come Monday so I advised her to be evaluated this evening at [facility] if she felt she needed to be seen sooner. Pt gave a verbal understanding.

## 2024-12-22 NOTE — Telephone Encounter (Signed)
" °  FYI Only or Action Required?: Action required by provider: clinical question for provider and medication request.  Patient was last seen in primary care on 09/06/2023 by Marylynn Verneita CROME, MD.  Called Nurse Triage reporting Nasal Congestion.  Symptoms began several days ago.  Interventions attempted: OTC medications: Afrin.  Symptoms are: gradually worsening.  Triage Disposition: Home Care  Patient/caregiver understands and will follow disposition?: Yes    Message from Ameerah G sent at 12/22/2024  8:31 AM EST  Reason for Triage: sometimes headache, sinus issues, no fever, some sneezing/coughing. Very congested. Would like sometime called in  [facility] - [place], [place] - [address] [place] [address] Phone: [phone] Fax: [phone]   Reason for Disposition  [1] Sinus congestion as part of a cold AND [2] present < 10 days  Answer Assessment - Initial Assessment Questions 1. LOCATION: Where does it hurt?      Nasal congestion  2. ONSET: When did the sinus pain start?  (e.g., hours, days)      X 5 days  3. SEVERITY: How bad is the pain?   (Scale 0-10; or none, mild, moderate or severe)     Worse since onset  4. RECURRENT SYMPTOM: Have you ever had sinus problems before? If Yes, ask: When was the last time? and What happened that time?      Recurrent  5. NASAL CONGESTION: Is the nose blocked? If Yes, ask: Can you open it or must you breathe through your mouth?     Yes   6. NASAL DISCHARGE: Do you have discharge from your nose? If so ask, What color?     Green  7. FEVER: Do you have a fever? If Yes, ask: What is it, how was it measured, and when did it start?      Denies  8. OTHER SYMPTOMS: Pt denies sore throat,  earache,     Cough/trouble breathing due to congestion   Pt reports congstion, headache, sneezing, cough Pt is taking OTC Afrin Pt declines in office visit.  Pt requesting to have medication sent to  pharmacy for symptoms if possible.  Pt agrees with plan of care, will call back for any worsening symptoms  Protocols used: Sinus Pain or Congestion-A-AH  "

## 2024-12-25 ENCOUNTER — Telehealth: Admitting: Emergency Medicine

## 2024-12-25 DIAGNOSIS — B9689 Other specified bacterial agents as the cause of diseases classified elsewhere: Secondary | ICD-10-CM

## 2024-12-25 DIAGNOSIS — J019 Acute sinusitis, unspecified: Secondary | ICD-10-CM

## 2024-12-25 MED ORDER — PREDNISONE 10 MG (21) PO TBPK: ORAL_TABLET | ORAL | 0 refills | Status: AC

## 2024-12-25 MED ORDER — AMOXICILLIN-POT CLAVULANATE 875-125 MG PO TABS: 1.0000 | ORAL_TABLET | Freq: Two times a day (BID) | ORAL | 0 refills | Status: AC

## 2024-12-25 NOTE — Patient Instructions (Signed)
 " Darice CHRISTELLA Roys, thank you for joining Jon CHRISTELLA Belt, NP for today's virtual visit.  While this provider is not your primary care provider (PCP), if your PCP is located in our provider database this encounter information will be shared with them immediately following your visit.   A [place] MyChart account gives you access to today's visit and all your visits, tests, and labs performed at [facility]  click here if you don't have a [facility] MyChart account or go to [email or web address]  Consent: (Patient) Krista Olson provided verbal consent for this virtual visit at the beginning of the encounter.  Current Medications:  Current Outpatient Medications:    amoxicillin -clavulanate (AUGMENTIN ) 875-125 MG tablet, Take 1 tablet by mouth 2 (two) times daily., Disp: 14 tablet, Rfl: 0   predniSONE  (STERAPRED UNI-PAK 21 TAB) 10 MG (21) TBPK tablet, Take 6 tabs on day 1; take 5 tabs day 2; take 4 tabs day 3; take 3 tabs day 4; take 2 tabs day 5; take 1 tab day 6, Disp: 21 tablet, Rfl: 0   atorvastatin  (LIPITOR) 10 MG tablet, TAKE 1 TABLET BY MOUTH AT BEDTIME, Disp: 90 tablet, Rfl: 3   azaTHIOprine  (IMURAN ) 50 MG tablet, Take 50 mg by mouth daily., Disp: , Rfl:    azelastine  (ASTELIN ) 0.1 % nasal spray, Place 1 spray into both nostrils 2 (two) times daily. Use in each nostril as directed, Disp: 30 mL, Rfl: 12   fluticasone  (FLONASE ) 50 MCG/ACT nasal spray, Place 2 sprays into both nostrils daily., Disp: 16 g, Rfl: 6   levothyroxine  (SYNTHROID ) 50 MCG tablet, TAKE 1 TABLET BY MOUTH ONCE DAILY ON AN EMPTY STOMACH. WAIT 30 MINUTES BEFORE TAKING OTHER MEDS., Disp: 90 tablet, Rfl: 0   Oxymetazoline  HCl (RHOFADE ) 1 % CREA, Apply topically to aa of face for rosacea in the morning (Patient taking differently: Apply 1 application  topically daily as needed. Apply topically to aa of face for rosacea in the morning), Disp: 30 g, Rfl: 1   raloxifene  (EVISTA ) 60 MG tablet, Take 1 tablet (60 mg  total) by mouth daily., Disp: 90 tablet, Rfl: 0   tretinoin  (RETIN-A ) 0.05 % cream, Apply topically a  pea sized amount to face qhs as tolerated. (Patient taking differently: Apply 1 application  topically daily as needed. Apply topically a  pea sized amount to face qhs as tolerated.), Disp: 45 g, Rfl: 11   Medications ordered in this encounter:  Meds ordered this encounter  Medications   amoxicillin -clavulanate (AUGMENTIN ) 875-125 MG tablet    Sig: Take 1 tablet by mouth 2 (two) times daily.    Dispense:  14 tablet    Refill:  0   predniSONE  (STERAPRED UNI-PAK 21 TAB) 10 MG (21) TBPK tablet    Sig: Take 6 tabs on day 1; take 5 tabs day 2; take 4 tabs day 3; take 3 tabs day 4; take 2 tabs day 5; take 1 tab day 6    Dispense:  21 tablet    Refill:  0     *If you need refills on other medications prior to your next appointment, please contact your pharmacy*  Follow-Up: Call back or seek an in-person evaluation if the symptoms worsen or if the condition fails to improve as anticipated.     Other Instructions  It is very important you stop using Afrin. It is only ok to use it for 3 days before it starts causing rebound (worse) congestion.   Continue neti pot  several times a day while you are sick. Many neti pots come with salt packets premeasured to use to make saline. If you use your own salt, make sure it is kosher salt or sea salt (don't use table salt as it has iodine in it and you don't need that in your nose). Use distilled water to make saline. If you mix your own saline using your own salt, the recipe is 1/4 teaspoon salt in 1 cup warm water. Using saline irrigation can help prevent and treat sinus infections.    If you have been instructed to have an in-person evaluation today at a local Urgent Care facility, please use the link below. It will take you to a list of all of our available [facility], including address, phone number and hours of operation. Please do not  delay care.  [place]  If you or a family member do not have a primary care provider, use the link below to schedule a visit and establish care. When you choose a [facility] primary care physician or advanced practice provider, you gain a long-term partner in health. Find a Primary Care Provider  Learn more about [facility]'s in-office and virtual care options: [place] - Get Care Now  "

## 2024-12-25 NOTE — Progress Notes (Signed)
 " Virtual Visit Consent   Krista Olson, you are scheduled for a virtual visit with a [facility] provider today. Just as with appointments in the office, your consent must be obtained to participate. Your consent will be active for this visit and any virtual visit you may have with one of our providers in the next 365 days. If you have a MyChart account, a copy of this consent can be sent to you electronically.  As this is a virtual visit, video technology does not allow for your provider to perform a traditional examination. This may limit your provider's ability to fully assess your condition. If your provider identifies any concerns that need to be evaluated in person or the need to arrange testing (such as labs, EKG, etc.), we will make arrangements to do so. Although advances in technology are sophisticated, we cannot ensure that it will always work on either your end or our end. If the connection with a video visit is poor, the visit may have to be switched to a telephone visit. With either a video or telephone visit, we are not always able to ensure that we have a secure connection.  By engaging in this virtual visit, you consent to the provision of healthcare and authorize for your insurance to be billed (if applicable) for the services provided during this visit. Depending on your insurance coverage, you may receive a charge related to this service.  I need to obtain your verbal consent now. Are you willing to proceed with your visit today? Krista Olson has provided verbal consent on 12/25/2024 for a virtual visit (video or telephone). Jon CHRISTELLA Belt, NP  Date: 12/25/2024 11:41 AM   Virtual Visit via Video Note   I, Jon CHRISTELLA Belt, connected with  Krista Olson  ([id number], [birth year]) on 12/25/24 at 11:30 AM EST by a video-enabled telemedicine application and verified that I am speaking with the correct person using two identifiers.  Location: Patient: Virtual Visit Location Patient:  Home Provider: Virtual Visit Location Provider: Home Office   I discussed the limitations of evaluation and management by telemedicine and the availability of in person appointments. The patient expressed understanding and agreed to proceed.    History of Present Illness: Krista Olson is a 69 y.o. who identifies as a female who was assigned female at birth, and is being seen today for sinus infection. Sick for 8 days now. B max sinus pressure. Nasal discharge is green, thick and clumpy in the morning. No fever or chills.   Taking Afrin BID, allergy meds, using neti pot. Not imprving.   PCP rx astelin  12/22/24, pt has not picked it up yet.   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  Resting comfortably  at home.  Head is normocephalic, atraumatic.  No labored breathing.  Speech is clear and coherent with logical content.  Patient is alert  and oriented at baseline.    Assessment and Plan: 1. Acute bacterial sinusitis (Primary) - amoxicillin -clavulanate (AUGMENTIN ) 875-125 MG tablet; Take 1 tablet by mouth 2 (two) times daily.  Dispense: 14 tablet; Refill: 0 - predniSONE  (STERAPRED UNI-PAK 21 TAB) 10 MG (21) TBPK tablet; Take 6 tabs on day 1; take 5 tabs day 2; take 4 tabs day 3; take 3 tabs  day 4; take 2 tabs day 5; take 1 tab day 6  Dispense: 21 tablet; Refill: 0  Discussed need to stop using Afrin, switch to astelin . Continue other home care.   Follow Up Instructions: I discussed the assessment and treatment plan with the patient. The patient was provided an opportunity to ask questions and all were answered. The patient agreed with the plan and demonstrated an understanding of the instructions.  A copy of instructions were sent to the patient via MyChart unless otherwise noted below.   The patient was advised to call back or seek an in-person evaluation if the symptoms worsen or if the condition fails to improve as anticipated.    Jon CHRISTELLA Belt, NP    [1] No Known Allergies [2]  Current Outpatient Medications:    amoxicillin -clavulanate (AUGMENTIN ) 875-125 MG tablet, Take 1 tablet by mouth 2 (two) times daily., Disp: 14 tablet, Rfl: 0   predniSONE  (STERAPRED UNI-PAK 21 TAB) 10 MG (21) TBPK tablet, Take 6 tabs on day 1; take 5 tabs day 2; take 4 tabs day 3; take 3 tabs day 4; take 2 tabs day 5; take 1 tab day 6, Disp: 21 tablet, Rfl: 0   atorvastatin  (LIPITOR) 10 MG tablet, TAKE 1 TABLET BY MOUTH AT BEDTIME, Disp: 90 tablet, Rfl: 3   azaTHIOprine  (IMURAN ) 50 MG tablet, Take 50 mg by mouth daily., Disp: , Rfl:    azelastine  (ASTELIN ) 0.1 % nasal spray, Place 1 spray into both nostrils 2 (two) times daily. Use in each nostril as directed, Disp: 30 mL, Rfl: 12   fluticasone  (FLONASE ) 50 MCG/ACT nasal spray, Place 2 sprays into both nostrils daily., Disp: 16 g, Rfl: 6   levothyroxine  (SYNTHROID ) 50 MCG tablet, TAKE 1 TABLET BY MOUTH ONCE DAILY ON AN EMPTY STOMACH. WAIT 30 MINUTES BEFORE TAKING OTHER MEDS., Disp: 90 tablet, Rfl: 0   Oxymetazoline  HCl (RHOFADE ) 1 % CREA, Apply topically to aa of face for rosacea in the morning (Patient taking differently: Apply 1 application  topically daily as needed. Apply topically to aa of face for rosacea in the morning), Disp: 30 g, Rfl: 1    raloxifene  (EVISTA ) 60 MG tablet, Take 1 tablet (60 mg total) by mouth daily., Disp: 90 tablet, Rfl: 0   tretinoin  (RETIN-A ) 0.05 % cream, Apply topically a  pea sized amount to face qhs as tolerated. (Patient taking differently: Apply 1 application  topically daily as needed. Apply topically a  pea sized amount to face qhs as tolerated.), Disp: 45 g, Rfl: 11  "

## 2024-12-26 NOTE — Telephone Encounter (Signed)
 Spoke with pt and she stated that she had a virtual visit yesterday. She was prescribed an antibiotic and also picked up the nasal sprays Dr. Marylynn sent in last week.

## 2025-01-15 ENCOUNTER — Encounter: Admitting: Internal Medicine

## 2025-04-04 ENCOUNTER — Ambulatory Visit
